# Patient Record
Sex: Female | Born: 1960 | Race: White | Hispanic: No | Marital: Single | Smoking: Former smoker
Health system: Southern US, Community
[De-identification: ages and names within clinical notes are randomized; demographics above are authoritative.]

## PROBLEM LIST (undated history)

## (undated) HISTORY — PX: BREAST ENHANCEMENT SURGERY: SHX7

## (undated) HISTORY — PX: ABDOMINOPLASTY: SUR9

## (undated) HISTORY — PX: LEG SURGERY: SHX1003

---

## 2006-08-07 ENCOUNTER — Encounter: Admission: RE | Admit: 2006-08-07 | Discharge: 2006-08-07 | Payer: Self-pay | Admitting: Obstetrics and Gynecology

## 2007-09-16 ENCOUNTER — Encounter: Admission: RE | Admit: 2007-09-16 | Discharge: 2007-09-16 | Payer: Self-pay | Admitting: Obstetrics and Gynecology

## 2011-04-16 ENCOUNTER — Emergency Department (HOSPITAL_COMMUNITY)
Admission: EM | Admit: 2011-04-16 | Discharge: 2011-04-17 | Disposition: A | Discharge status: Home / Self Care | Payer: BC Managed Care – PPO | Attending: Emergency Medicine | Admitting: Emergency Medicine

## 2011-04-16 DIAGNOSIS — R0609 Other forms of dyspnea: Secondary | ICD-10-CM | POA: Insufficient documentation

## 2011-04-16 DIAGNOSIS — R0989 Other specified symptoms and signs involving the circulatory and respiratory systems: Secondary | ICD-10-CM | POA: Insufficient documentation

## 2011-04-16 DIAGNOSIS — M7989 Other specified soft tissue disorders: Secondary | ICD-10-CM | POA: Insufficient documentation

## 2011-04-16 DIAGNOSIS — M79609 Pain in unspecified limb: Secondary | ICD-10-CM | POA: Insufficient documentation

## 2011-04-17 ENCOUNTER — Emergency Department (HOSPITAL_COMMUNITY)
Admission: EM | Admit: 2011-04-17 | Discharge: 2011-04-17 | Disposition: A | Discharge status: Home / Self Care | Payer: BC Managed Care – PPO | Attending: Emergency Medicine | Admitting: Emergency Medicine

## 2011-04-17 ENCOUNTER — Ambulatory Visit (HOSPITAL_COMMUNITY)
Admission: RE | Admit: 2011-04-17 | Discharge: 2011-04-17 | Disposition: A | Discharge status: Home / Self Care | Payer: BC Managed Care – PPO | Source: Ambulatory Visit

## 2011-04-17 DIAGNOSIS — M79609 Pain in unspecified limb: Secondary | ICD-10-CM | POA: Insufficient documentation

## 2011-04-17 DIAGNOSIS — M7989 Other specified soft tissue disorders: Secondary | ICD-10-CM | POA: Insufficient documentation

## 2011-04-17 DIAGNOSIS — I824Z9 Acute embolism and thrombosis of unspecified deep veins of unspecified distal lower extremity: Secondary | ICD-10-CM | POA: Insufficient documentation

## 2012-07-13 ENCOUNTER — Other Ambulatory Visit: Payer: Self-pay | Admitting: Internal Medicine

## 2012-07-13 DIAGNOSIS — R609 Edema, unspecified: Secondary | ICD-10-CM

## 2012-07-14 ENCOUNTER — Ambulatory Visit
Admission: RE | Admit: 2012-07-14 | Discharge: 2012-07-14 | Disposition: A | Discharge status: Home / Self Care | Payer: BC Managed Care – PPO | Source: Ambulatory Visit | Attending: Internal Medicine | Admitting: Internal Medicine

## 2012-07-14 DIAGNOSIS — R609 Edema, unspecified: Secondary | ICD-10-CM

## 2015-07-10 ENCOUNTER — Emergency Department (HOSPITAL_COMMUNITY): Payer: No Typology Code available for payment source

## 2015-07-10 ENCOUNTER — Encounter (HOSPITAL_COMMUNITY): Payer: Self-pay | Admitting: Family Medicine

## 2015-07-10 ENCOUNTER — Emergency Department (HOSPITAL_COMMUNITY)
Admission: EM | Admit: 2015-07-10 | Discharge: 2015-07-10 | Disposition: A | Discharge status: Home / Self Care | Payer: No Typology Code available for payment source | Attending: Emergency Medicine | Admitting: Emergency Medicine

## 2015-07-10 DIAGNOSIS — R002 Palpitations: Secondary | ICD-10-CM | POA: Diagnosis not present

## 2015-07-10 DIAGNOSIS — Z7982 Long term (current) use of aspirin: Secondary | ICD-10-CM | POA: Diagnosis not present

## 2015-07-10 DIAGNOSIS — Z79899 Other long term (current) drug therapy: Secondary | ICD-10-CM | POA: Diagnosis not present

## 2015-07-10 DIAGNOSIS — Z87891 Personal history of nicotine dependence: Secondary | ICD-10-CM | POA: Insufficient documentation

## 2015-07-10 DIAGNOSIS — R0789 Other chest pain: Secondary | ICD-10-CM

## 2015-07-10 LAB — BASIC METABOLIC PANEL
Anion gap: 9 (ref 5–15)
BUN: 13 mg/dL (ref 6–20)
CO2: 24 mmol/L (ref 22–32)
CREATININE: 0.7 mg/dL (ref 0.44–1.00)
Calcium: 9.2 mg/dL (ref 8.9–10.3)
Chloride: 106 mmol/L (ref 101–111)
Glucose, Bld: 108 mg/dL — ABNORMAL HIGH (ref 65–99)
Potassium: 3.7 mmol/L (ref 3.5–5.1)
SODIUM: 139 mmol/L (ref 135–145)

## 2015-07-10 LAB — CBC
HCT: 38.6 % (ref 36.0–46.0)
Hemoglobin: 13.2 g/dL (ref 12.0–15.0)
MCH: 34.6 pg — ABNORMAL HIGH (ref 26.0–34.0)
MCHC: 34.2 g/dL (ref 30.0–36.0)
MCV: 101 fL — ABNORMAL HIGH (ref 78.0–100.0)
PLATELETS: 234 10*3/uL (ref 150–400)
RBC: 3.82 MIL/uL — ABNORMAL LOW (ref 3.87–5.11)
RDW: 12 % (ref 11.5–15.5)
WBC: 7.5 10*3/uL (ref 4.0–10.5)

## 2015-07-10 LAB — I-STAT TROPONIN, ED
TROPONIN I, POC: 0 ng/mL (ref 0.00–0.08)
TROPONIN I, POC: 0 ng/mL (ref 0.00–0.08)

## 2015-07-10 MED ORDER — ASPIRIN 81 MG PO CHEW
324.0000 mg | CHEWABLE_TABLET | Freq: Once | ORAL | Status: AC
  Administered 2015-07-10: 324 mg via ORAL
  Filled 2015-07-10: qty 4

## 2015-07-10 MED ORDER — NITROGLYCERIN 2 % TD OINT
1.0000 [in_us] | TOPICAL_OINTMENT | Freq: Once | TRANSDERMAL | Status: AC
  Administered 2015-07-10: 1 [in_us] via TOPICAL
  Filled 2015-07-10: qty 30

## 2015-07-10 NOTE — Discharge Instructions (Signed)
You might consider stopping the topiramate and the phentermine which may be causing some of your symptoms of feeling like her heart is beating too hard or too fast. You can call Dr. Michaelle CopasSmith's office to have him recheck you. Your tests in the ED today were normal without evidence of a acute cardiac problem. Your heart rate was normal and your cardiac enzymes which would indicate a heart attack were normal. Recheck if you get worse.   Palpitations A palpitation is the feeling that your heartbeat is irregular or is faster than normal. It may feel like your heart is fluttering or skipping a beat. Palpitations are usually not a serious problem. However, in some cases, you may need further medical evaluation. CAUSES  Palpitations can be caused by:  Smoking.  Caffeine or other stimulants, such as diet pills or energy drinks.  Alcohol.  Stress and anxiety.  Strenuous physical activity.  Fatigue.  Certain medicines.  Heart disease, especially if you have a history of irregular heart rhythms (arrhythmias), such as atrial fibrillation, atrial flutter, or supraventricular tachycardia.  An improperly working pacemaker or defibrillator. DIAGNOSIS  To find the cause of your palpitations, your health care provider will take your medical history and perform a physical exam. Your health care provider may also have you take a test called an ambulatory electrocardiogram (ECG). An ECG records your heartbeat patterns over a 24-hour period. You may also have other tests, such as:  Transthoracic echocardiogram (TTE). During echocardiography, sound waves are used to evaluate how blood flows through your heart.  Transesophageal echocardiogram (TEE).  Cardiac monitoring. This allows your health care provider to monitor your heart rate and rhythm in real time.  Holter monitor. This is a portable device that records your heartbeat and can help diagnose heart arrhythmias. It allows your health care provider to  track your heart activity for several days, if needed.  Stress tests by exercise or by giving medicine that makes the heart beat faster. TREATMENT  Treatment of palpitations depends on the cause of your symptoms and can vary greatly. Most cases of palpitations do not require any treatment other than time, relaxation, and monitoring your symptoms. Other causes, such as atrial fibrillation, atrial flutter, or supraventricular tachycardia, usually require further treatment. HOME CARE INSTRUCTIONS   Avoid:  Caffeinated coffee, tea, soft drinks, diet pills, and energy drinks.  Chocolate.  Alcohol.  Stop smoking if you smoke.  Reduce your stress and anxiety. Things that can help you relax include:  A method of controlling things in your body, such as your heartbeats, with your mind (biofeedback).  Yoga.  Meditation.  Physical activity such as swimming, jogging, or walking.  Get plenty of rest and sleep. SEEK MEDICAL CARE IF:   You continue to have a fast or irregular heartbeat beyond 24 hours.  Your palpitations occur more often. SEEK IMMEDIATE MEDICAL CARE IF:  You have chest pain or shortness of breath.  You have a severe headache.  You feel dizzy or you faint. MAKE SURE YOU:  Understand these instructions.  Will watch your condition.  Will get help right away if you are not doing well or get worse.   This information is not intended to replace advice given to you by your health care provider. Make sure you discuss any questions you have with your health care provider.   Document Released: 08/23/2000 Document Revised: 08/31/2013 Document Reviewed: 10/25/2011 Elsevier Interactive Patient Education [employer]2016 Elsevier Inc.

## 2015-07-10 NOTE — ED Notes (Addendum)
Awake. Verbally responsive. A/O x4. Resp even and unlabored. No audible adventitious breath sounds noted. ABC's intact. SR on monitor. Pt reported improvement in chest pain since Nitro paste application. IV saline lock patent and intact. Family at bedside.

## 2015-07-10 NOTE — ED Notes (Signed)
Removed nitropaste from chest wall area

## 2015-07-10 NOTE — ED Notes (Signed)
Pt states she started experiencing a fast, hard heart beat starting around 22:00 last night. Pt started taking Phentermine and Topiramate about 2 weeks ago.   :

## 2015-07-10 NOTE — ED Provider Notes (Signed)
CSN: [id number]     Arrival date & time 07/10/15  0425 History   First MD Initiated Contact with Patient 07/10/15 [phone]   Chief Complaint  Patient presents with  . Palpitations     (Consider location/radiation/quality/duration/timing/severity/associated sxs/prior Treatment) HPI  Patient states she laid down and tried to go to sleep about 10 PM however she felt like her heart was not beating right and states she felt like it was beating hard in beating fast. She states she still feels it at the time of my exam and when I look at the monitor her heart rate is in the 70s. She states she had some chest tightness that has been coming and going and is worse when she lays down. She denies shortness of breath, diaphoresis, nausea, or vomiting. She states she has seen Dr. Garnette ScheuermannHank Smith about 6 or 7 years ago while she was taking high-dose prednisone for some poison ivy. At that time she had some fast heartbeat. She does not recall that he did any tests such as a stress test and he would not start her on any new medication. She states the only other thing that is new in her life is 2 weeks ago she started taking phentermine and topiramate for weight loss. Her only other medication are 2 baby aspirin in the morning which she has not taken yet today.   PCP Dr Jerelyn ScottN [facility] Dr Mendel RyderH Smith  History reviewed. No pertinent past medical history. Past Surgical History  Procedure Laterality Date  . Leg surgery Right     Femur   History reviewed. No pertinent family history. Social History  Substance Use Topics  . Smoking status: Former Games developermoker  . Smokeless tobacco: None  . Alcohol Use: No   Employed Social ETOH  OB History    No data available     Review of Systems  All other systems reviewed and are negative.     Allergies  Review of patient's allergies indicates no known allergies.  Home Medications   Prior to Admission medications   Medication Sig Start Date End Date Taking? Authorizing  Provider  aspirin EC 81 MG tablet Take 162 mg by mouth daily.   Yes Historical Provider, MD  phentermine 15 MG capsule Take 15 mg by mouth daily. 06/07/15  Yes Historical Provider, MD  topiramate (TOPAMAX) 25 MG tablet Take 25 mg by mouth 2 (two) times daily. 06/07/15  Yes Historical Provider, MD   BP 118/75 mmHg  Pulse 64  Temp(Src) 97.4 F (36.3 C) (Oral)  Resp 15  Ht 5\' 4"  (1.626 m)  Wt 135 lb (61.236 kg)  BMI 23.16 kg/m2  SpO2 94%  LMP   Vital signs normal   Physical Exam  Constitutional: She is oriented to person, place, and time. She appears well-developed and well-nourished.  Non-toxic appearance. She does not appear ill. No distress.  HENT:  Head: Normocephalic and atraumatic.  Right Ear: External ear normal.  Left Ear: External ear normal.  Nose: Nose normal. No mucosal edema or rhinorrhea.  Mouth/Throat: Oropharynx is clear and moist and mucous membranes are normal. No dental abscesses or uvula swelling.  Eyes: Conjunctivae and EOM are normal. Pupils are equal, round, and reactive to light.  Neck: Normal range of motion and full passive range of motion without pain. Neck supple.  Cardiovascular: Normal rate, regular rhythm and normal heart sounds.  Exam reveals no gallop and no friction rub.   No murmur heard. Pulmonary/Chest: Effort normal and breath sounds  normal. No respiratory distress. She has no wheezes. She has no rhonchi. She has no rales. She exhibits no tenderness and no crepitus.  Abdominal: Soft. Normal appearance and bowel sounds are normal. She exhibits no distension. There is no tenderness. There is no rebound and no guarding.  Musculoskeletal: Normal range of motion. She exhibits no edema or tenderness.  Moves all extremities well.   Neurological: She is alert and oriented to person, place, and time. She has normal strength. No cranial nerve deficit.  Skin: Skin is warm, dry and intact. No rash noted. No erythema. No pallor.  Psychiatric: She has a normal  mood and affect. Her speech is normal and behavior is normal. Her mood appears not anxious.  Nursing note and vitals reviewed.   ED Course  Procedures (including critical care time)  Medications  aspirin chewable tablet 324 mg (324 mg Oral Given 07/10/15 0548)  nitroGLYCERIN (NITROGLYN) 2 % ointment 1 inch (1 inch Topical Given 07/10/15 0549)     Patient was placed on a monitored bed. She had no ectopy seen.   Recheck 07:25 we discussed her test results. Will get second troponin and if negative she can be discharged.    Patient was given her negative second troponin. She was discharged from the ED. We discussed stopping her topiramate and her phentermine since these are the only new things that she has been exposed to that could be causing some of her palpitations.  Labs Review Results for orders placed or performed during the hospital encounter of 07/10/15  Basic metabolic panel  Result Value Ref Range   Sodium 139 135 - 145 mmol/L   Potassium 3.7 3.5 - 5.1 mmol/L   Chloride 106 101 - 111 mmol/L   CO2 24 22 - 32 mmol/L   Glucose, Bld 108 (H) 65 - 99 mg/dL   BUN 13 6 - 20 mg/dL   Creatinine, Ser 1.61 0.44 - 1.00 mg/dL   Calcium 9.2 8.9 - 09.6 mg/dL   GFR calc non Af Amer >60 >60 mL/min   GFR calc Af Amer >60 >60 mL/min   Anion gap 9 5 - 15  CBC  Result Value Ref Range   WBC 7.5 4.0 - 10.5 K/uL   RBC 3.82 (L) 3.87 - 5.11 MIL/uL   Hemoglobin 13.2 12.0 - 15.0 g/dL   HCT 04.5 40.9 - 81.1 %   MCV 101.0 (H) 78.0 - 100.0 fL   MCH 34.6 (H) 26.0 - 34.0 pg   MCHC 34.2 30.0 - 36.0 g/dL   RDW 91.4 78.2 - 95.6 %   Platelets 234 150 - 400 K/uL  I-stat troponin, ED  Result Value Ref Range   Troponin i, poc 0.00 0.00 - 0.08 ng/mL   Comment 3          I-stat troponin, ED  Result Value Ref Range   Troponin i, poc 0.00 0.00 - 0.08 ng/mL   Comment 3           Laboratory interpretation all normal     Imaging Review Dg Chest 2 View  07/10/2015  CLINICAL DATA:  Palpitations,  onset at 22:00. EXAM: CHEST  2 VIEW COMPARISON:  None. FINDINGS: The heart size and mediastinal contours are within normal limits. Both lungs are clear. The visualized skeletal structures are unremarkable. IMPRESSION: No active cardiopulmonary disease. Electronically Signed   By: Ellery Plunk M.D.   On: 07/10/2015 05:42   I have personally reviewed and evaluated these images and lab results as  part of my medical decision-making.   EKG Interpretation   Date/Time:  Monday July 10 2015 04:37:42 EDT Ventricular Rate:  81 PR Interval:  144 QRS Duration: 79 QT Interval:  374 QTC Calculation: 434 R Axis:   36 Text Interpretation:  Sinus rhythm Baseline wander in lead(s) II aVR aVF  Otherwise within normal limits No old tracing to compare Confirmed by  Hester Forget  MD-I, Marquesa Rath ([id number]) on 07/10/2015 4:48:24 AM      MDM   Final diagnoses:  Palpitations  Chest tightness   Plan discharge  Devoria Albe, MD, Concha Pyo, MD 07/10/15 [phone]

## 2015-07-10 NOTE — ED Notes (Signed)
Awake. Verbally responsive. A/O x4. Resp even and unlabored. No audible adventitious breath sounds noted. ABC's intact.  

## 2017-11-10 ENCOUNTER — Ambulatory Visit
Admission: RE | Admit: 2017-11-10 | Discharge: 2017-11-10 | Disposition: A | Discharge status: Home / Self Care | Payer: No Typology Code available for payment source | Source: Ambulatory Visit | Attending: Internal Medicine | Admitting: Internal Medicine

## 2017-11-10 ENCOUNTER — Other Ambulatory Visit: Payer: Self-pay | Admitting: Internal Medicine

## 2017-11-10 DIAGNOSIS — R0781 Pleurodynia: Secondary | ICD-10-CM

## 2019-11-05 ENCOUNTER — Ambulatory Visit: Payer: No Typology Code available for payment source | Attending: Internal Medicine

## 2019-11-05 DIAGNOSIS — Z23 Encounter for immunization: Secondary | ICD-10-CM

## 2019-11-05 NOTE — Progress Notes (Signed)
   Covid-19 Vaccination Clinic  Name:  Sema Stangler    MRN: [id number] DOB: [birth year]  11/05/2019  Ms. Flythe was observed post Covid-19 immunization for 15 minutes without incidence. She was provided with Vaccine Information Sheet and instruction to access the V-Safe system.   Ms. Walthour was instructed to call 911 with any severe reactions post vaccine: Marland Kitchen Difficulty breathing  . Swelling of your face and throat  . A fast heartbeat  . A bad rash all over your body  . Dizziness and weakness    Immunizations Administered    Name Date Dose VIS Date Route   Pfizer COVID-19 Vaccine 11/05/2019  3:38 PM 0.3 mL 08/20/2019 Intramuscular   Manufacturer: [employer], [employer]   Lot: [id number]   NDC: [address]-2

## 2019-12-01 ENCOUNTER — Ambulatory Visit: Payer: No Typology Code available for payment source | Attending: Internal Medicine

## 2019-12-01 DIAGNOSIS — Z23 Encounter for immunization: Secondary | ICD-10-CM

## 2019-12-01 NOTE — Progress Notes (Signed)
   Covid-19 Vaccination Clinic  Name:  Kari Marks    MRN: [id number] DOB: Jan 23, 1961  12/01/2019  [name] was observed post Covid-19 immunization for 15 minutes without incident. She was provided with Vaccine Information Sheet and instruction to access the V-Safe system.   [name] was instructed to call 911 with any severe reactions post vaccine: Marland Kitchen Difficulty breathing  . Swelling of face and throat  . A fast heartbeat  . A bad rash all over body  . Dizziness and weakness   Immunizations Administered    Name Date Dose VIS Date Route   Pfizer COVID-19 Vaccine 12/01/2019  2:17 PM 0.3 mL 08/20/2019 Intramuscular   Manufacturer: [employer], [employer]   Lot: [id number]   NDC: [address]-6

## 2020-05-19 ENCOUNTER — Other Ambulatory Visit: Payer: Self-pay | Admitting: Internal Medicine

## 2020-05-19 ENCOUNTER — Ambulatory Visit
Admission: RE | Admit: 2020-05-19 | Discharge: 2020-05-19 | Disposition: A | Discharge status: Home / Self Care | Payer: No Typology Code available for payment source | Source: Ambulatory Visit | Attending: Internal Medicine | Admitting: Internal Medicine

## 2020-05-19 ENCOUNTER — Other Ambulatory Visit: Payer: Self-pay

## 2020-05-19 DIAGNOSIS — Z1231 Encounter for screening mammogram for malignant neoplasm of breast: Secondary | ICD-10-CM

## 2020-10-16 IMAGING — MG DIGITAL SCREENING BREAST BILAT IMPLANT W/ TOMO W/ CAD
9 of 14 series · 9 of 34 positions shown · non-contrast
Comparison: Previous exam(s).

CLINICAL DATA: Screening.

EXAM:
DIGITAL SCREENING BILATERAL MAMMOGRAM WITH IMPLANTS, CAD AND TOMO

[L MLO]
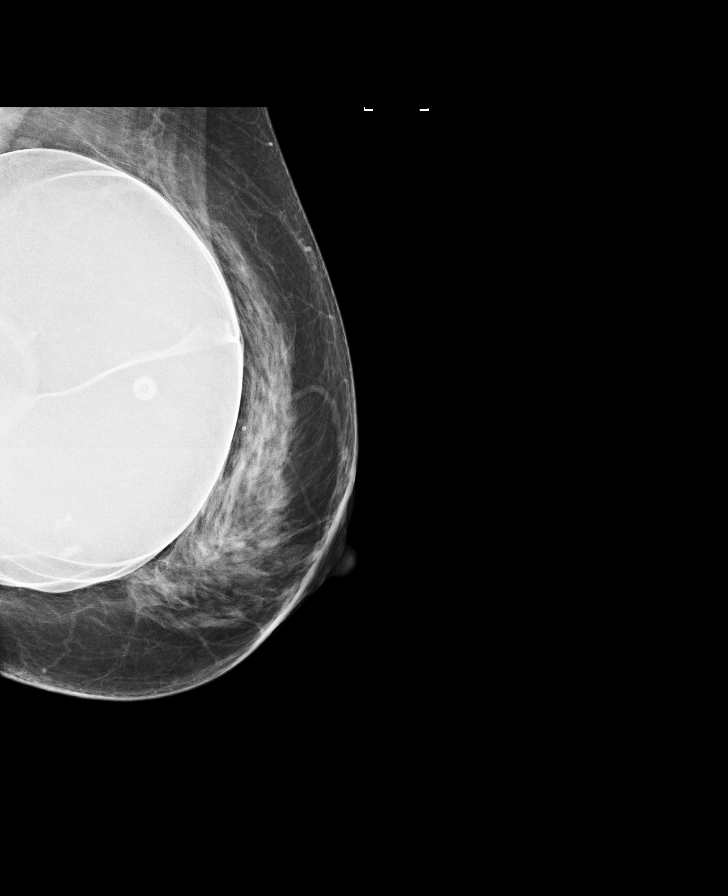

[R MLO]
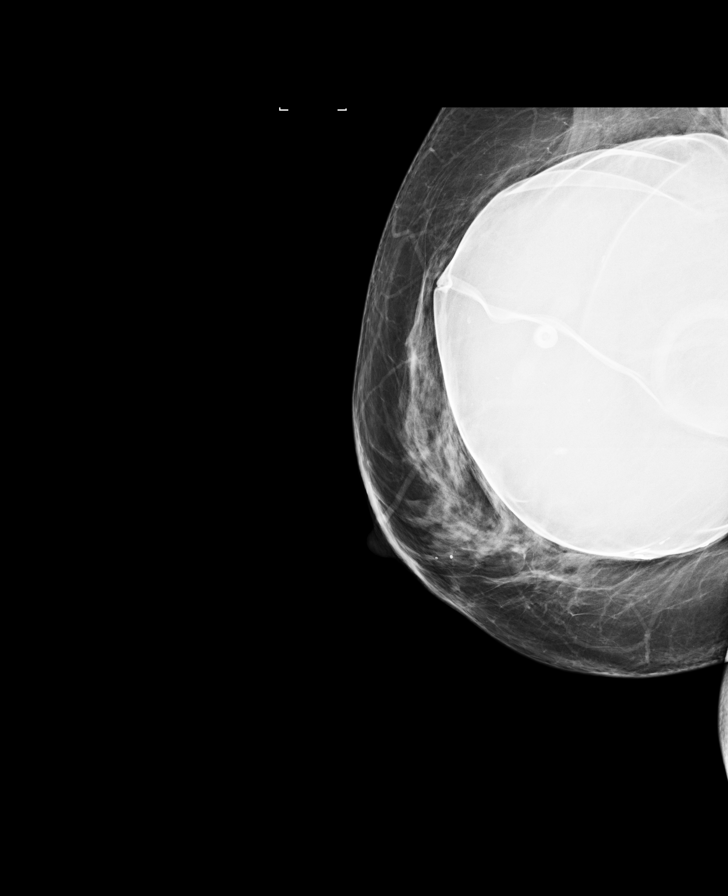

[R CC]
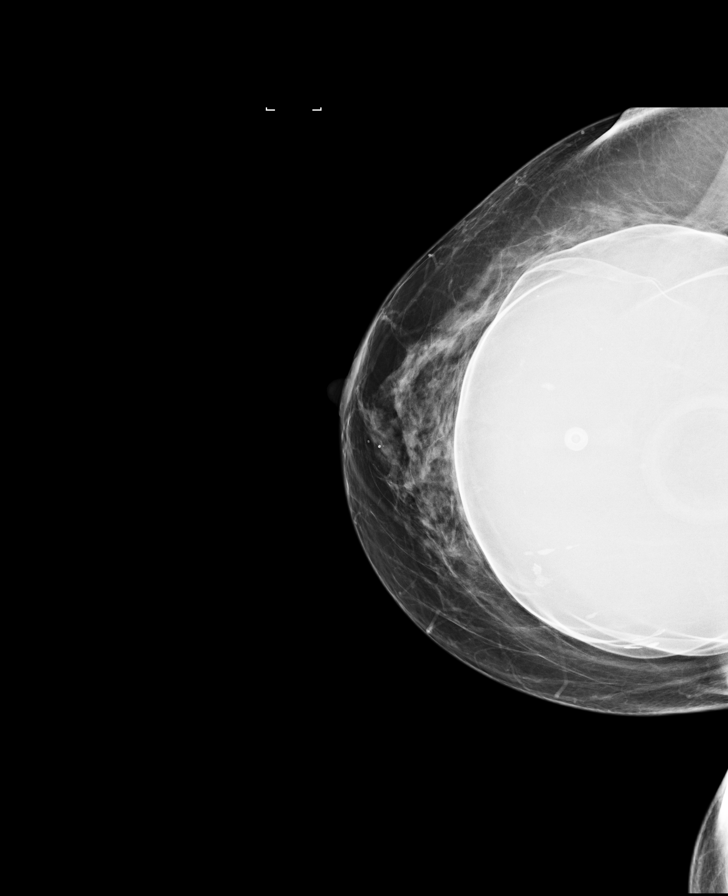

[L CC]
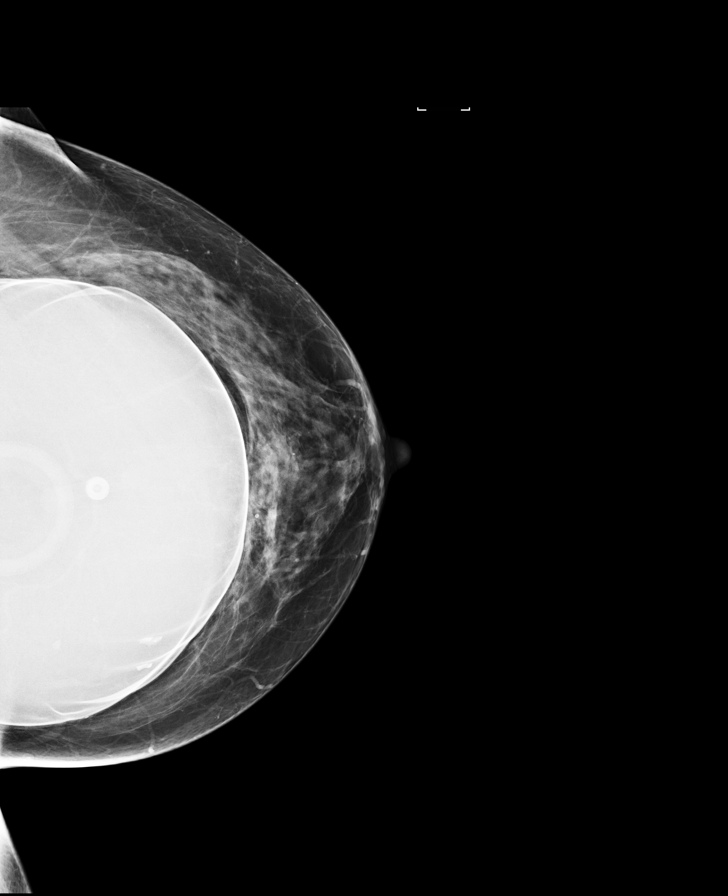

[R MLO synth-2D]
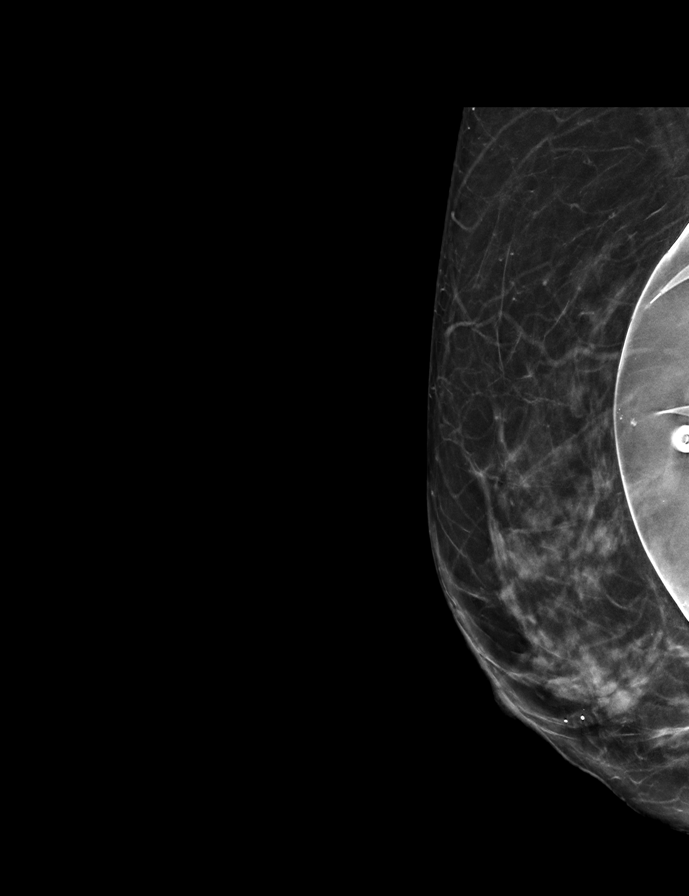

[L CC synth-2D]
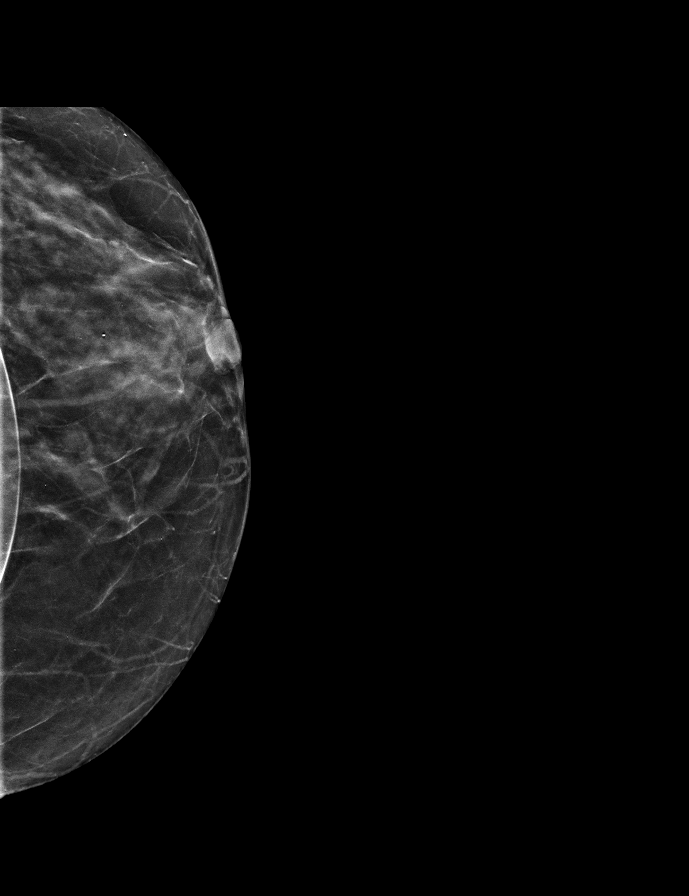

[R CC synth-2D]
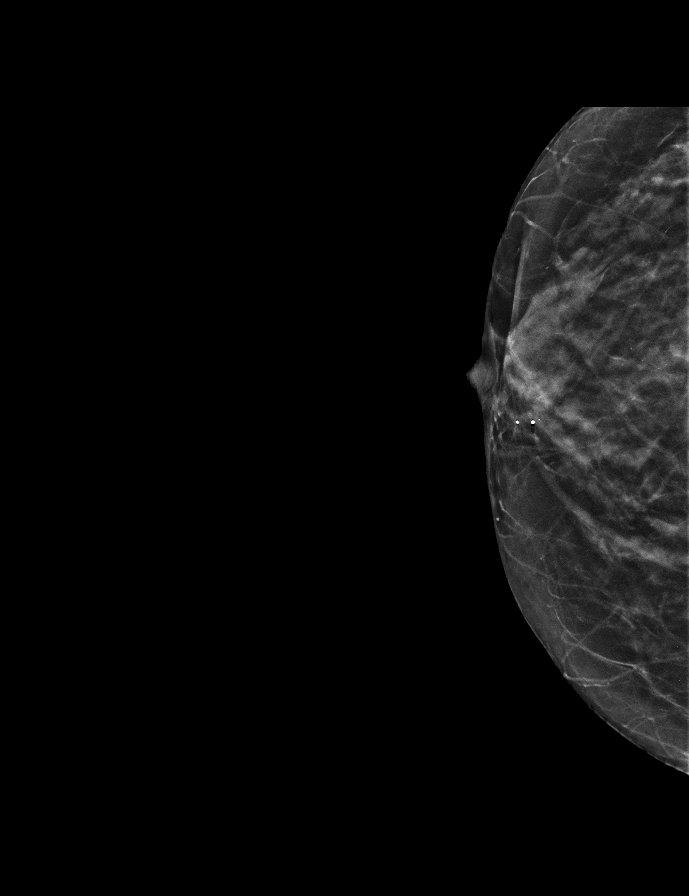

[L MLO synth-2D (1 of 2)]
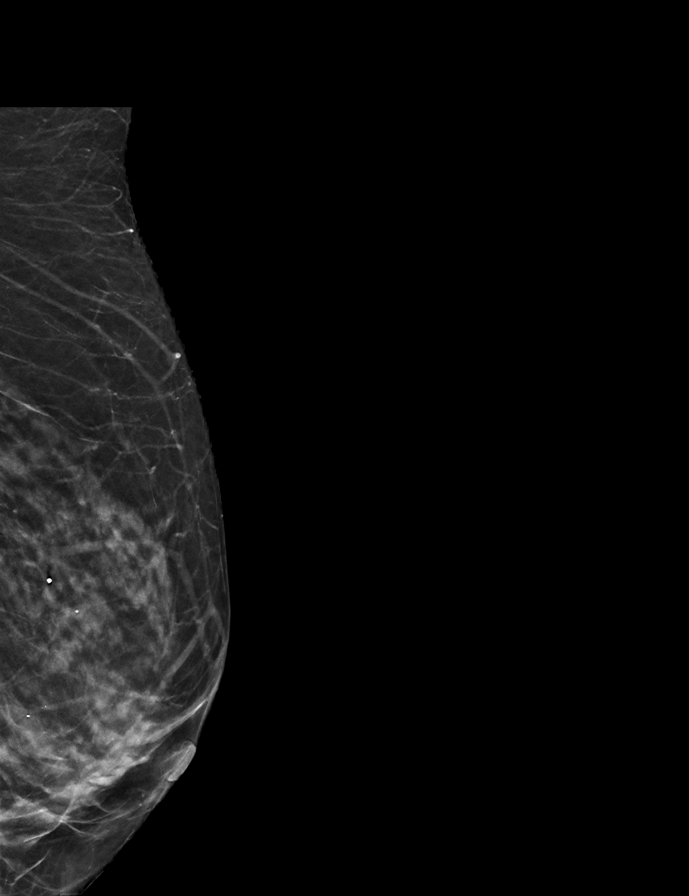

[L MLO synth-2D (2 of 2)]
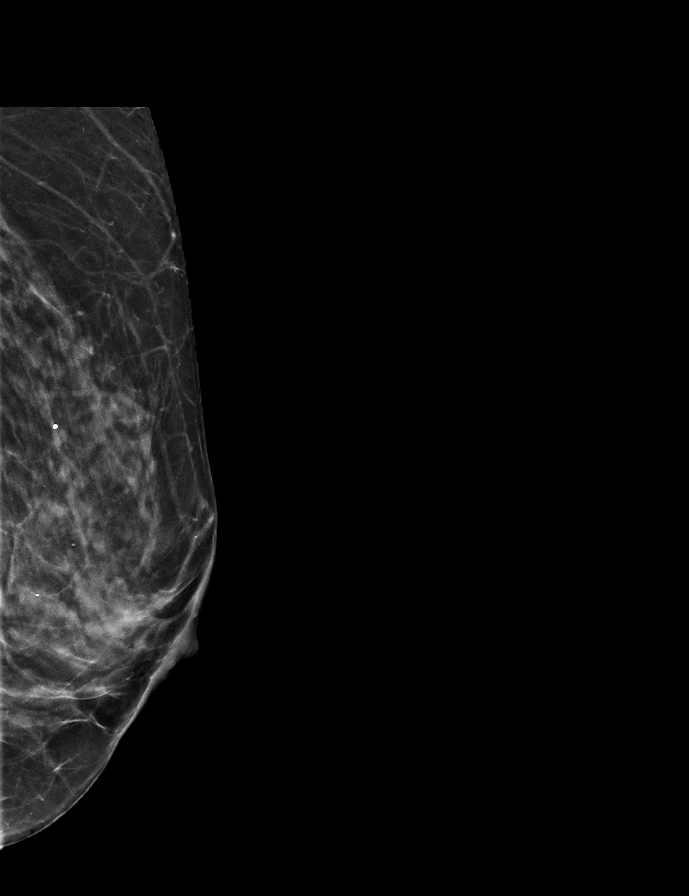

[9 of 34 positions shown; findings below may reference images not displayed]

ACR Breast Density Category b: There are scattered areas of
fibroglandular density.
FINDINGS: The patient has retropectoral saline implants. Standard 2D full
field CC and MLO views of both breasts and tomosynthesis and
synthesized implant displaced CC and MLO views of both breasts were
obtained.

There are no findings suspicious for malignancy. Images were
processed with CAD.
IMPRESSION: No mammographic evidence of malignancy. A result letter of this
screening mammogram will be mailed directly to the patient.

RECOMMENDATION:
Screening mammogram in one year. (Code:DI-[id number])

BI-RADS CATEGORY  1:  Negative.

## 2020-12-13 ENCOUNTER — Other Ambulatory Visit (HOSPITAL_COMMUNITY): Payer: Self-pay

## 2020-12-13 MED ORDER — METHOCARBAMOL 500 MG PO TABS
500.0000 mg | ORAL_TABLET | Freq: Four times a day (QID) | ORAL | 0 refills | Status: DC
  Filled 2020-12-13: qty 30, 8d supply, fill #0

## 2020-12-13 MED ORDER — ENOXAPARIN SODIUM 40 MG/0.4ML ~~LOC~~ SOLN
40.0000 mg | Freq: Every day | SUBCUTANEOUS | 0 refills | Status: DC
  Filled 2020-12-13: qty 12, 30d supply, fill #0

## 2020-12-13 MED ORDER — OXYCODONE-ACETAMINOPHEN 5-325 MG PO TABS
1.0000 | ORAL_TABLET | ORAL | 0 refills | Status: DC | PRN
  Filled 2020-12-13: qty 20, 4d supply, fill #0

## 2020-12-18 DIAGNOSIS — Z01818 Encounter for other preprocedural examination: Secondary | ICD-10-CM | POA: Diagnosis not present

## 2020-12-21 ENCOUNTER — Other Ambulatory Visit (HOSPITAL_COMMUNITY): Payer: Self-pay

## 2021-01-10 ENCOUNTER — Other Ambulatory Visit (HOSPITAL_COMMUNITY): Payer: Self-pay

## 2021-01-10 MED ORDER — ENOXAPARIN SODIUM 40 MG/0.4ML IJ SOSY
PREFILLED_SYRINGE | INTRAMUSCULAR | 0 refills | Status: DC
  Filled 2021-01-10: qty 2.8, 7d supply, fill #0

## 2021-01-11 ENCOUNTER — Other Ambulatory Visit (HOSPITAL_COMMUNITY): Payer: Self-pay

## 2021-02-19 ENCOUNTER — Other Ambulatory Visit (HOSPITAL_COMMUNITY): Payer: Self-pay

## 2021-02-19 DIAGNOSIS — Z13220 Encounter for screening for lipoid disorders: Secondary | ICD-10-CM | POA: Diagnosis not present

## 2021-02-19 DIAGNOSIS — Z0000 Encounter for general adult medical examination without abnormal findings: Secondary | ICD-10-CM | POA: Diagnosis not present

## 2021-02-19 DIAGNOSIS — Z1239 Encounter for other screening for malignant neoplasm of breast: Secondary | ICD-10-CM | POA: Diagnosis not present

## 2021-02-19 DIAGNOSIS — E559 Vitamin D deficiency, unspecified: Secondary | ICD-10-CM | POA: Diagnosis not present

## 2021-02-19 DIAGNOSIS — E538 Deficiency of other specified B group vitamins: Secondary | ICD-10-CM | POA: Diagnosis not present

## 2021-02-19 DIAGNOSIS — A6000 Herpesviral infection of urogenital system, unspecified: Secondary | ICD-10-CM | POA: Diagnosis not present

## 2021-02-19 DIAGNOSIS — Z1211 Encounter for screening for malignant neoplasm of colon: Secondary | ICD-10-CM | POA: Diagnosis not present

## 2021-02-19 MED ORDER — VITAMIN D3 50 MCG (2000 UT) PO CAPS: ORAL_CAPSULE | ORAL | 3 refills | Status: DC

## 2021-02-19 MED ORDER — ACYCLOVIR 400 MG PO TABS
400.0000 mg | ORAL_TABLET | Freq: Two times a day (BID) | ORAL | 3 refills | Status: DC | PRN
  Filled 2021-02-19: qty 60, 30d supply, fill #0

## 2021-02-20 ENCOUNTER — Other Ambulatory Visit (HOSPITAL_COMMUNITY): Payer: Self-pay

## 2021-06-15 DIAGNOSIS — Z1211 Encounter for screening for malignant neoplasm of colon: Secondary | ICD-10-CM | POA: Diagnosis not present

## 2021-06-15 DIAGNOSIS — Z1212 Encounter for screening for malignant neoplasm of rectum: Secondary | ICD-10-CM | POA: Diagnosis not present

## 2021-06-22 DIAGNOSIS — H2513 Age-related nuclear cataract, bilateral: Secondary | ICD-10-CM | POA: Diagnosis not present

## 2021-06-22 DIAGNOSIS — H5213 Myopia, bilateral: Secondary | ICD-10-CM | POA: Diagnosis not present

## 2021-06-22 DIAGNOSIS — H02831 Dermatochalasis of right upper eyelid: Secondary | ICD-10-CM | POA: Diagnosis not present

## 2021-08-31 DIAGNOSIS — Z01419 Encounter for gynecological examination (general) (routine) without abnormal findings: Secondary | ICD-10-CM | POA: Diagnosis not present

## 2021-08-31 DIAGNOSIS — Z113 Encounter for screening for infections with a predominantly sexual mode of transmission: Secondary | ICD-10-CM | POA: Diagnosis not present

## 2021-08-31 DIAGNOSIS — Z6825 Body mass index (BMI) 25.0-25.9, adult: Secondary | ICD-10-CM | POA: Diagnosis not present

## 2021-08-31 DIAGNOSIS — Z01411 Encounter for gynecological examination (general) (routine) with abnormal findings: Secondary | ICD-10-CM | POA: Diagnosis not present

## 2021-08-31 DIAGNOSIS — Z124 Encounter for screening for malignant neoplasm of cervix: Secondary | ICD-10-CM | POA: Diagnosis not present

## 2021-09-06 ENCOUNTER — Other Ambulatory Visit (HOSPITAL_COMMUNITY): Payer: Self-pay

## 2021-09-06 MED ORDER — CARESTART COVID-19 HOME TEST VI KIT
PACK | 1 refills | Status: DC
  Filled 2021-09-06: qty 4, 4d supply, fill #0

## 2021-09-11 DIAGNOSIS — M25551 Pain in right hip: Secondary | ICD-10-CM | POA: Diagnosis not present

## 2021-09-11 DIAGNOSIS — M79604 Pain in right leg: Secondary | ICD-10-CM | POA: Diagnosis not present

## 2021-09-11 DIAGNOSIS — M545 Low back pain, unspecified: Secondary | ICD-10-CM | POA: Diagnosis not present

## 2021-12-24 ENCOUNTER — Other Ambulatory Visit (HOSPITAL_COMMUNITY): Payer: Self-pay

## 2021-12-24 MED ORDER — OMRON 3 SERIES BP MONITOR DEVI
0 refills | Status: AC
  Filled 2021-12-24: qty 1, 30d supply, fill #0

## 2022-03-16 ENCOUNTER — Emergency Department (HOSPITAL_COMMUNITY)
Admission: EM | Admit: 2022-03-16 | Discharge: 2022-03-17 | Disposition: A | Discharge status: Home / Self Care | Payer: 59 | Attending: Emergency Medicine | Admitting: Emergency Medicine

## 2022-03-16 ENCOUNTER — Emergency Department (HOSPITAL_COMMUNITY): Payer: 59

## 2022-03-16 ENCOUNTER — Other Ambulatory Visit: Payer: Self-pay

## 2022-03-16 DIAGNOSIS — W010XXA Fall on same level from slipping, tripping and stumbling without subsequent striking against object, initial encounter: Secondary | ICD-10-CM | POA: Diagnosis not present

## 2022-03-16 DIAGNOSIS — S199XXA Unspecified injury of neck, initial encounter: Secondary | ICD-10-CM | POA: Diagnosis not present

## 2022-03-16 DIAGNOSIS — S022XXA Fracture of nasal bones, initial encounter for closed fracture: Secondary | ICD-10-CM | POA: Diagnosis not present

## 2022-03-16 DIAGNOSIS — M4319 Spondylolisthesis, multiple sites in spine: Secondary | ICD-10-CM | POA: Diagnosis not present

## 2022-03-16 DIAGNOSIS — S52612A Displaced fracture of left ulna styloid process, initial encounter for closed fracture: Secondary | ICD-10-CM | POA: Diagnosis not present

## 2022-03-16 DIAGNOSIS — M25532 Pain in left wrist: Secondary | ICD-10-CM | POA: Diagnosis not present

## 2022-03-16 DIAGNOSIS — S6992XA Unspecified injury of left wrist, hand and finger(s), initial encounter: Secondary | ICD-10-CM | POA: Diagnosis present

## 2022-03-16 DIAGNOSIS — Z7982 Long term (current) use of aspirin: Secondary | ICD-10-CM | POA: Insufficient documentation

## 2022-03-16 DIAGNOSIS — S0990XA Unspecified injury of head, initial encounter: Secondary | ICD-10-CM | POA: Diagnosis not present

## 2022-03-16 DIAGNOSIS — S52502A Unspecified fracture of the lower end of left radius, initial encounter for closed fracture: Secondary | ICD-10-CM | POA: Insufficient documentation

## 2022-03-16 DIAGNOSIS — M47812 Spondylosis without myelopathy or radiculopathy, cervical region: Secondary | ICD-10-CM | POA: Diagnosis not present

## 2022-03-16 DIAGNOSIS — S52572A Other intraarticular fracture of lower end of left radius, initial encounter for closed fracture: Secondary | ICD-10-CM | POA: Diagnosis not present

## 2022-03-16 DIAGNOSIS — M7989 Other specified soft tissue disorders: Secondary | ICD-10-CM | POA: Diagnosis not present

## 2022-03-16 DIAGNOSIS — J929 Pleural plaque without asbestos: Secondary | ICD-10-CM | POA: Diagnosis not present

## 2022-03-16 MED ORDER — LIDOCAINE HCL (PF) 1 % IJ SOLN
10.0000 mL | Freq: Once | INTRAMUSCULAR | Status: AC
  Administered 2022-03-16: 10 mL
  Filled 2022-03-16: qty 30

## 2022-03-16 MED ORDER — OXYCODONE-ACETAMINOPHEN 5-325 MG PO TABS
1.0000 | ORAL_TABLET | Freq: Once | ORAL | Status: DC
  Filled 2022-03-16: qty 1

## 2022-03-16 MED ORDER — ONDANSETRON 4 MG PO TBDP
4.0000 mg | ORAL_TABLET | Freq: Once | ORAL | Status: DC
  Filled 2022-03-16: qty 1

## 2022-03-16 MED ORDER — ONDANSETRON 4 MG PO TBDP
4.0000 mg | ORAL_TABLET | Freq: Once | ORAL | Status: AC
  Administered 2022-03-16: 4 mg via ORAL
  Filled 2022-03-16: qty 1

## 2022-03-16 MED ORDER — HYDROMORPHONE HCL 1 MG/ML IJ SOLN
1.0000 mg | Freq: Once | INTRAMUSCULAR | Status: AC
  Administered 2022-03-16: 1 mg via INTRAMUSCULAR
  Filled 2022-03-16: qty 1

## 2022-03-16 NOTE — ED Provider Triage Note (Signed)
Emergency Medicine Provider Triage Evaluation Note  Kari Marks , a 61 y.o. female  was evaluated in triage.  Pt complains of ground level fall. Earlier today the patient tripped and landed on her left wrist which "gave out". She scrapped up her face and tore her frenulum in her mouth. Denies any LOC. She denies any neck or back pain.   Review of Systems  Positive:  Negative:   Physical Exam  Pulse 84   Temp 98.4 F (36.9 C) (Oral)   Resp 18   Ht 5\' 4"  (1.626 m)   Wt 61.2 kg   SpO2 95%   BMI 23.17 kg/m  Gen:   Awake, no distress   Resp:  Normal effort  MSK:   Moves extremities without difficulty  Other:  Deformity to the left wrist. Cap refill intact. Palpable pulses. Sensation intact. Coloration symmetric.   Medical Decision Making  Medically screening exam initiated at 8:47 PM.  Appropriate orders placed.  Abriel Hattery was informed that the remainder of the evaluation will be completed by another provider, this initial triage assessment does not replace that evaluation, and the importance of remaining in the ED until their evaluation is complete.  Will order CT imaging and left wrist xr.    Fredia Sorrow, Achille Rich 03/16/22 2049

## 2022-03-16 NOTE — Discharge Instructions (Addendum)
Please follow-up with Dr. Merlyn Lot, the hand specialist, call his office on Monday to get an appointment.  He said you will need surgery for this fracture.   For your nose fracture, follow-up with Dr. Jenne Pane.  For pain control recommend taking Tylenol Motrin.  For breakthrough pain take the Percocet.  If you develop severe pain, swelling, numbness, skin discoloration in your hand, come back to ER for reassessment.  Recommend no weightbearing in your left arm.

## 2022-03-16 NOTE — ED Triage Notes (Signed)
Ambulatory to ED s/p fall after tripping over a stump. Landed on L wrist and then face planted. + deformity on L wrist, bloody nose with swelling. No blood thinners, no LOC. 2+ pulses, full sensation in L wrist.

## 2022-03-16 NOTE — ED Provider Notes (Signed)
[facility] DEPT Provider Note   CSN: [id number] Arrival date & time: 03/16/22  2016     History {Add pertinent medical, surgical, social history, OB history to HPI:1} Chief Complaint  Patient presents with   Kari Marks is a 61 y.o. female.  Presenting to ER after fall.  HPI     Home Medications Prior to Admission medications   Medication Sig Start Date End Date Taking? Authorizing Provider  acyclovir (ZOVIRAX) 400 MG tablet Take 1 tablet (400 mg total) by mouth 2 (two) times daily as needed. 02/19/21     aspirin EC 81 MG tablet Take 162 mg by mouth daily.    [provider]  Blood Pressure Monitoring (OMRON 3 SERIES BP MONITOR) DEVI Use as directed 12/24/21   Belanger, Kellie Shropshire, RPH  Cholecalciferol (VITAMIN D3) 50 MCG (2000 UT) capsule Take 1 capsule by mouth once daily 02/19/21     COVID-19 At Home Antigen Test [facility] COVID-19 HOME TEST) KIT Use as directed 09/06/21   Edmon Crape, RPH  enoxaparin (LOVENOX) 40 MG/0.4ML injection Inject 0.4 mLs (40 mg total) into the skin on alternating thigh daily. 12/13/20     enoxaparin (LOVENOX) 40 MG/0.4ML injection Inject 1 syringe into the skin daily for 7 days. (Alternate thigh daily) 01/10/21     methocarbamol (ROBAXIN) 500 MG tablet Take 1 tablet (500 mg total) by mouth 4 (four) times daily. 12/13/20     oxyCODONE-acetaminophen (PERCOCET/ROXICET) 5-325 MG tablet Take 1 tablet by mouth every 4 (four) hours as needed for up to 20 doses. 12/13/20     phentermine 15 MG capsule Take 15 mg by mouth daily. 06/07/15   [provider]  topiramate (TOPAMAX) 25 MG tablet Take 25 mg by mouth 2 (two) times daily. 06/07/15   [provider]      Allergies    Patient has no known allergies.    Review of Systems   Review of Systems  Physical Exam Updated Vital Signs BP 122/84 (BP Location: Right Arm)   Pulse 84   Temp 98.4 F (36.9 C) (Oral)   Resp 18   Ht 5' 4" (1.626 m)    Wt 61.2 kg   SpO2 95%   BMI 23.17 kg/m  Physical Exam  ED Results / Procedures / Treatments   Labs (all labs ordered are listed, but only abnormal results are displayed) Labs Reviewed - No data to display  EKG None  Radiology CT Maxillofacial Wo Contrast  Result Date: 03/16/2022 CLINICAL DATA:  Facial trauma EXAM: CT MAXILLOFACIAL WITHOUT CONTRAST TECHNIQUE: Multidetector CT imaging of the maxillofacial structures was performed. Multiplanar CT image reconstructions were also generated. RADIATION DOSE REDUCTION: This exam was performed according to the departmental dose-optimization program which includes automated exposure control, adjustment of the mA and/or kV according to patient size and/or use of iterative reconstruction technique. COMPARISON:  None Available. FINDINGS: Osseous: Acute fractures of the bilateral nasal bones with a 2 mm displacement of the left nasal bone fracture fragment and 1 mm depression of the right nasal bone fracture. No additional fracture identified. Orbits: Negative. No traumatic or inflammatory finding. Sinuses: Clear. Soft tissues: Mild soft tissue swelling over the nasal region. Limited intracranial: No acute process. IMPRESSION: Acute bilateral nasal bone fractures. Electronically Signed   By: Ofilia Neas M.D.   On: 03/16/2022 21:43   DG Wrist Complete Left  Result Date: 03/16/2022 CLINICAL DATA:  Fall, landed on left wrist EXAM: LEFT WRIST -  COMPLETE 3+ VIEW COMPARISON:  Hand series today FINDINGS: There is a comminuted intra-articular fracture through the distal left radius. Distal fracture fragments are displaced and angulated posteriorly. Displaced ulnar styloid fracture. Overlying soft tissue swelling. No subluxation or dislocation. IMPRESSION: Comminuted, intra-articular displaced and angulated distal left radial fracture. Ulnar styloid fracture. Electronically Signed   By: Rolm Baptise M.D.   On: 03/16/2022 21:26   DG Hand Complete Left  Result  Date: 03/16/2022 CLINICAL DATA:  Fall, left wrist pain EXAM: LEFT HAND - COMPLETE 3+ VIEW COMPARISON:  Left wrist today FINDINGS: Comminuted intra-articular fracture noted in the distal left radius. Displaced ulnar styloid fracture also noted. These are better visualized on wrist series. No additional fracture within the left hand. IMPRESSION: Distal left radial and ulnar styloid fractures seen, better visualized on wrist series. Electronically Signed   By: Rolm Baptise M.D.   On: 03/16/2022 21:24   CT Cervical Spine Wo Contrast  Result Date: 03/16/2022 CLINICAL DATA:  Neck trauma EXAM: CT CERVICAL SPINE WITHOUT CONTRAST TECHNIQUE: Multidetector CT imaging of the cervical spine was performed without intravenous contrast. Multiplanar CT image reconstructions were also generated. RADIATION DOSE REDUCTION: This exam was performed according to the departmental dose-optimization program which includes automated exposure control, adjustment of the mA and/or kV according to patient size and/or use of iterative reconstruction technique. COMPARISON:  None Available. FINDINGS: Alignment: Slight reversal of the normal lordotic curvature, likely degenerative and/or positional. Minimal grade 1 anterolisthesis of C3 on C4 and C7 on T1. Skull base and vertebrae: No acute fracture. No primary bone lesion or focal pathologic process. Soft tissues and spinal canal: No prevertebral fluid or swelling. No visible canal hematoma. Disc levels: Moderate to severe intervertebral disc space narrowing at C4-C5 and moderate narrowing at C5-C6 and C6-C7. Associated endplate sclerosis, dorsal endplate osteophytes and uncovertebral spurring. Facet arthropathy most significant on the right at C3-C4. Upper chest: No acute process identified. Mild biapical pleural thickening. Other: None. IMPRESSION: Degenerative changes with no acute fracture or malalignment identified. Electronically Signed   By: Ofilia Neas M.D.   On: 03/16/2022 21:20    CT Head Wo Contrast  Result Date: 03/16/2022 CLINICAL DATA:  Head trauma, fall EXAM: CT HEAD WITHOUT CONTRAST TECHNIQUE: Contiguous axial images were obtained from the base of the skull through the vertex without intravenous contrast. RADIATION DOSE REDUCTION: This exam was performed according to the departmental dose-optimization program which includes automated exposure control, adjustment of the mA and/or kV according to patient size and/or use of iterative reconstruction technique. COMPARISON:  None Available. FINDINGS: Brain: No acute intracranial hemorrhage, mass effect, or herniation. No extra-axial fluid collections. No evidence of acute territorial infarct. No hydrocephalus. Vascular: No hyperdense vessel or unexpected calcification. Skull: Normal. Negative for fracture or focal lesion. Sinuses/Orbits: No acute finding. Other: None. IMPRESSION: No acute intracranial process identified. Electronically Signed   By: Ofilia Neas M.D.   On: 03/16/2022 21:17    Procedures Procedures  {Document cardiac monitor, telemetry assessment procedure when appropriate:1}  Medications Ordered in ED Medications  oxyCODONE-acetaminophen (PERCOCET/ROXICET) 5-325 MG per tablet 1 tablet (1 tablet Oral Patient Refused/Not Given 03/16/22 2136)  ondansetron (ZOFRAN-ODT) disintegrating tablet 4 mg (4 mg Oral Patient Refused/Not Given 03/16/22 2136)  lidocaine (PF) (XYLOCAINE) 1 % injection 10 mL (10 mLs Infiltration Given by Other 03/16/22 2226)  HYDROmorphone (DILAUDID) injection 1 mg (1 mg Intramuscular Given 03/16/22 2223)  ondansetron (ZOFRAN-ODT) disintegrating tablet 4 mg (4 mg Oral Given 03/16/22 2307)    ED  Course/ Medical Decision Making/ A&P                           Medical Decision Making Amount and/or Complexity of Data Reviewed Radiology: ordered.  Risk Prescription drug management.   ***  {Document critical care time when appropriate:1} {Document review of labs and clinical decision tools  ie heart score, Chads2Vasc2 etc:1}  {Document your independent review of radiology images, and any outside records:1} {Document your discussion with family members, caretakers, and with consultants:1} {Document social determinants of health affecting pt's care:1} {Document your decision making why or why not admission, treatments were needed:1} Final Clinical Impression(s) / ED Diagnoses Final diagnoses:  None    Rx / DC Orders ED Discharge Orders     None

## 2022-03-16 NOTE — ED Notes (Signed)
Ortho Tech at [facility] called to come apply splint

## 2022-03-17 DIAGNOSIS — S52502A Unspecified fracture of the lower end of left radius, initial encounter for closed fracture: Secondary | ICD-10-CM | POA: Diagnosis not present

## 2022-03-17 DIAGNOSIS — Z7982 Long term (current) use of aspirin: Secondary | ICD-10-CM | POA: Diagnosis not present

## 2022-03-17 DIAGNOSIS — S022XXA Fracture of nasal bones, initial encounter for closed fracture: Secondary | ICD-10-CM | POA: Diagnosis not present

## 2022-03-17 MED ORDER — OXYCODONE-ACETAMINOPHEN 5-325 MG PO TABS: 1.0000 | ORAL_TABLET | Freq: Four times a day (QID) | ORAL | 0 refills | Status: DC | PRN

## 2022-03-17 NOTE — Progress Notes (Signed)
Orthopedic Tech Progress Note Patient Details:  Kari Marks 1961-03-12 [id number]  Ortho Devices Type of Ortho Device: Arm sling, Sugartong splint, Finger trap Finger Trap Weight: 5lb Ortho Device/Splint Location: lue Ortho Device/Splint Interventions: Ordered, Application, Adjustment  Plaster splint. Finger traps applied at drs request. Dr assisted with splint application. Dr molded splint Post Interventions Patient Tolerated: Well Instructions Provided: Care of device, Adjustment of device  [name] Post 03/17/2022, 12:02 AM

## 2022-03-18 ENCOUNTER — Other Ambulatory Visit: Payer: Self-pay | Admitting: Orthopedic Surgery

## 2022-03-18 ENCOUNTER — Encounter (HOSPITAL_BASED_OUTPATIENT_CLINIC_OR_DEPARTMENT_OTHER): Payer: Self-pay | Admitting: Orthopedic Surgery

## 2022-03-18 DIAGNOSIS — S52572A Other intraarticular fracture of lower end of left radius, initial encounter for closed fracture: Secondary | ICD-10-CM | POA: Diagnosis not present

## 2022-03-19 ENCOUNTER — Encounter (HOSPITAL_BASED_OUTPATIENT_CLINIC_OR_DEPARTMENT_OTHER): Payer: Self-pay | Admitting: Orthopedic Surgery

## 2022-03-19 ENCOUNTER — Ambulatory Visit (HOSPITAL_BASED_OUTPATIENT_CLINIC_OR_DEPARTMENT_OTHER): Payer: 59 | Admitting: Certified Registered"

## 2022-03-19 ENCOUNTER — Other Ambulatory Visit (HOSPITAL_COMMUNITY): Payer: Self-pay

## 2022-03-19 ENCOUNTER — Other Ambulatory Visit: Payer: Self-pay

## 2022-03-19 ENCOUNTER — Encounter (HOSPITAL_BASED_OUTPATIENT_CLINIC_OR_DEPARTMENT_OTHER)
Admission: RE | Disposition: A | Discharge status: Home / Self Care | Payer: Self-pay | Source: Home / Self Care | Attending: Orthopedic Surgery

## 2022-03-19 ENCOUNTER — Ambulatory Visit (HOSPITAL_BASED_OUTPATIENT_CLINIC_OR_DEPARTMENT_OTHER)
Admission: RE | Admit: 2022-03-19 | Discharge: 2022-03-19 | Disposition: A | Discharge status: Home / Self Care | Payer: 59 | Attending: Orthopedic Surgery | Admitting: Orthopedic Surgery

## 2022-03-19 DIAGNOSIS — Z87891 Personal history of nicotine dependence: Secondary | ICD-10-CM | POA: Insufficient documentation

## 2022-03-19 DIAGNOSIS — W1830XA Fall on same level, unspecified, initial encounter: Secondary | ICD-10-CM | POA: Diagnosis not present

## 2022-03-19 DIAGNOSIS — S52572A Other intraarticular fracture of lower end of left radius, initial encounter for closed fracture: Secondary | ICD-10-CM

## 2022-03-19 DIAGNOSIS — Z01818 Encounter for other preprocedural examination: Secondary | ICD-10-CM

## 2022-03-19 HISTORY — PX: FLEXOR TENOTOMY: SHX6342

## 2022-03-19 HISTORY — PX: OPEN REDUCTION INTERNAL FIXATION (ORIF) DISTAL RADIAL FRACTURE: SHX5989

## 2022-03-19 SURGERY — OPEN REDUCTION INTERNAL FIXATION (ORIF) DISTAL RADIUS FRACTURE
Anesthesia: Monitor Anesthesia Care | Site: Wrist | Laterality: Left

## 2022-03-19 MED ORDER — CEFAZOLIN SODIUM-DEXTROSE 2-4 GM/100ML-% IV SOLN
INTRAVENOUS | Status: AC
  Filled 2022-03-19: qty 100

## 2022-03-19 MED ORDER — FENTANYL CITRATE (PF) 100 MCG/2ML IJ SOLN: 25.0000 ug | INTRAMUSCULAR | Status: DC | PRN

## 2022-03-19 MED ORDER — FENTANYL CITRATE (PF) 100 MCG/2ML IJ SOLN
100.0000 ug | Freq: Once | INTRAMUSCULAR | Status: AC
  Administered 2022-03-19: 100 ug via INTRAVENOUS

## 2022-03-19 MED ORDER — ONDANSETRON HCL 4 MG/2ML IJ SOLN
INTRAMUSCULAR | Status: DC | PRN
  Administered 2022-03-19: 4 mg via INTRAVENOUS

## 2022-03-19 MED ORDER — GLYCOPYRROLATE PF 0.2 MG/ML IJ SOSY
PREFILLED_SYRINGE | INTRAMUSCULAR | Status: AC
  Filled 2022-03-19: qty 1

## 2022-03-19 MED ORDER — CLONIDINE HCL (ANALGESIA) 100 MCG/ML EP SOLN: EPIDURAL | Status: DC | PRN

## 2022-03-19 MED ORDER — CLONIDINE HCL (ANALGESIA) 100 MCG/ML EP SOLN
EPIDURAL | Status: DC | PRN
  Administered 2022-03-19: 100 ug

## 2022-03-19 MED ORDER — FENTANYL CITRATE (PF) 100 MCG/2ML IJ SOLN
INTRAMUSCULAR | Status: AC
  Filled 2022-03-19: qty 2

## 2022-03-19 MED ORDER — MIDAZOLAM HCL 2 MG/2ML IJ SOLN
2.0000 mg | Freq: Once | INTRAMUSCULAR | Status: AC
  Administered 2022-03-19: 2 mg via INTRAVENOUS

## 2022-03-19 MED ORDER — MEPERIDINE HCL 25 MG/ML IJ SOLN: 6.2500 mg | INTRAMUSCULAR | Status: DC | PRN

## 2022-03-19 MED ORDER — PROPOFOL 500 MG/50ML IV EMUL
INTRAVENOUS | Status: DC | PRN
  Administered 2022-03-19 (×2): 100 ug/kg/min via INTRAVENOUS

## 2022-03-19 MED ORDER — CEFAZOLIN SODIUM-DEXTROSE 2-4 GM/100ML-% IV SOLN
2.0000 g | INTRAVENOUS | Status: AC
  Administered 2022-03-19: 2 g via INTRAVENOUS

## 2022-03-19 MED ORDER — ONDANSETRON HCL 4 MG/2ML IJ SOLN: 4.0000 mg | Freq: Once | INTRAMUSCULAR | Status: DC | PRN

## 2022-03-19 MED ORDER — GLYCOPYRROLATE 0.2 MG/ML IJ SOLN
INTRAMUSCULAR | Status: DC | PRN
  Administered 2022-03-19 (×2): .1 mg via INTRAVENOUS

## 2022-03-19 MED ORDER — ROPIVACAINE HCL 5 MG/ML IJ SOLN
INTRAMUSCULAR | Status: DC | PRN
  Administered 2022-03-19 (×4): 5 mL via PERINEURAL

## 2022-03-19 MED ORDER — ONDANSETRON HCL 4 MG/2ML IJ SOLN
INTRAMUSCULAR | Status: AC
  Filled 2022-03-19: qty 2

## 2022-03-19 MED ORDER — PROPOFOL 10 MG/ML IV BOLUS
INTRAVENOUS | Status: DC | PRN
  Administered 2022-03-19: 30 mg via INTRAVENOUS
  Administered 2022-03-19: 20 mg via INTRAVENOUS

## 2022-03-19 MED ORDER — MIDAZOLAM HCL 2 MG/2ML IJ SOLN
INTRAMUSCULAR | Status: AC
  Filled 2022-03-19: qty 2

## 2022-03-19 MED ORDER — LACTATED RINGERS IV SOLN: INTRAVENOUS | Status: DC

## 2022-03-19 MED ORDER — ACETAMINOPHEN 325 MG PO TABS: 325.0000 mg | ORAL_TABLET | ORAL | Status: DC | PRN

## 2022-03-19 MED ORDER — DEXAMETHASONE SODIUM PHOSPHATE 10 MG/ML IJ SOLN
INTRAMUSCULAR | Status: DC | PRN
  Administered 2022-03-19: 10 mg

## 2022-03-19 MED ORDER — KETOROLAC TROMETHAMINE 30 MG/ML IJ SOLN: 30.0000 mg | Freq: Once | INTRAMUSCULAR | Status: DC | PRN

## 2022-03-19 MED ORDER — OXYCODONE-ACETAMINOPHEN 5-325 MG PO TABS
ORAL_TABLET | ORAL | 0 refills | Status: AC
  Filled 2022-03-19: qty 20, 3d supply, fill #0

## 2022-03-19 MED ORDER — ACETAMINOPHEN 160 MG/5ML PO SOLN: 325.0000 mg | ORAL | Status: DC | PRN

## 2022-03-19 SURGICAL SUPPLY — 62 items
APL PRP STRL LF DISP 70% ISPRP (MISCELLANEOUS) ×1
BIT DRILL 2.0 LNG QUCK RELEASE (BIT) IMPLANT
BIT DRILL QC 2.8X5 (BIT) ×1 IMPLANT
BLADE SURG 15 STRL LF DISP TIS (BLADE) ×2 IMPLANT
BLADE SURG 15 STRL SS (BLADE) ×4
BNDG CMPR 9X4 STRL LF SNTH (GAUZE/BANDAGES/DRESSINGS) ×1
BNDG ELASTIC 3X5.8 VLCR STR LF (GAUZE/BANDAGES/DRESSINGS) ×2 IMPLANT
BNDG ESMARK 4X9 LF (GAUZE/BANDAGES/DRESSINGS) ×2 IMPLANT
BNDG GAUZE DERMACEA FLUFF (GAUZE/BANDAGES/DRESSINGS) ×1
BNDG GAUZE DERMACEA FLUFF 4 (GAUZE/BANDAGES/DRESSINGS) ×1 IMPLANT
BNDG GZE DERMACEA 4 6PLY (GAUZE/BANDAGES/DRESSINGS) ×1
BNDG PLASTER X FAST 3X3 WHT LF (CAST SUPPLIES) ×20 IMPLANT
BNDG PLSTR 9X3 FST ST WHT (CAST SUPPLIES) ×10
CHLORAPREP W/TINT 26 (MISCELLANEOUS) ×2 IMPLANT
CORD BIPOLAR FORCEPS 12FT (ELECTRODE) ×2 IMPLANT
COVER BACK TABLE 60X90IN (DRAPES) ×2 IMPLANT
COVER MAYO STAND STRL (DRAPES) ×2 IMPLANT
CUFF TOURN SGL QUICK 18X4 (TOURNIQUET CUFF) IMPLANT
CUFF TOURN SGL QUICK 24 (TOURNIQUET CUFF)
CUFF TRNQT CYL 24X4X16.5-23 (TOURNIQUET CUFF) IMPLANT
DRAPE EXTREMITY T 121X128X90 (DISPOSABLE) ×2 IMPLANT
DRAPE OEC MINIVIEW 54X84 (DRAPES) ×2 IMPLANT
DRAPE SURG 17X23 STRL (DRAPES) ×2 IMPLANT
DRILL 2.0 LNG QUICK RELEASE (BIT) ×2
GAUZE SPONGE 4X4 12PLY STRL (GAUZE/BANDAGES/DRESSINGS) ×2 IMPLANT
GAUZE XEROFORM 1X8 LF (GAUZE/BANDAGES/DRESSINGS) ×2 IMPLANT
GLOVE BIO SURGEON STRL SZ7.5 (GLOVE) ×2 IMPLANT
GLOVE BIOGEL PI IND STRL 8 (GLOVE) ×1 IMPLANT
GLOVE BIOGEL PI IND STRL 8.5 (GLOVE) IMPLANT
GLOVE BIOGEL PI INDICATOR 8 (GLOVE) ×1
GLOVE BIOGEL PI INDICATOR 8.5 (GLOVE)
GLOVE SURG ORTHO 8.0 STRL STRW (GLOVE) IMPLANT
GOWN STRL REUS W/ TWL LRG LVL3 (GOWN DISPOSABLE) ×1 IMPLANT
GOWN STRL REUS W/TWL LRG LVL3 (GOWN DISPOSABLE) ×2
GOWN STRL REUS W/TWL XL LVL3 (GOWN DISPOSABLE) ×2 IMPLANT
GUIDEWIRE ORTHO 0.054X6 (WIRE) ×3 IMPLANT
NDL HYPO 25X1 1.5 SAFETY (NEEDLE) IMPLANT
NEEDLE HYPO 25X1 1.5 SAFETY (NEEDLE) IMPLANT
NS IRRIG 1000ML POUR BTL (IV SOLUTION) ×2 IMPLANT
PACK BASIN DAY SURGERY FS (CUSTOM PROCEDURE TRAY) ×2 IMPLANT
PAD CAST 3X4 CTTN HI CHSV (CAST SUPPLIES) ×1 IMPLANT
PADDING CAST COTTON 3X4 STRL (CAST SUPPLIES) ×2
PLATE PROX NARROW LEFT (Plate) ×1 IMPLANT
SCREW CORT FT 18X2.3XLCK HEX (Screw) IMPLANT
SCREW CORT FX14X2.3XLCK NS (Screw) IMPLANT
SCREW CORTICAL LOCKING 2.3X14M (Screw) ×4 IMPLANT
SCREW CORTICAL LOCKING 2.3X16M (Screw) ×2 IMPLANT
SCREW CORTICAL LOCKING 2.3X18M (Screw) ×6 IMPLANT
SCREW FX16X2.3XLCK SMTH NS CRT (Screw) IMPLANT
SCREW FX18X2.3XSMTH LCK NS CRT (Screw) IMPLANT
SCREW NLCKG 13 3.5X13 HEXA (Screw) IMPLANT
SCREW NON LOCK 3.5X10MM (Screw) ×1 IMPLANT
SCREW NON-LOCK 3.5X13 (Screw) ×2 IMPLANT
SCREW NONLOCK HEX 3.5X12 (Screw) ×1 IMPLANT
SLEEVE SCD COMPRESS KNEE MED (STOCKING) IMPLANT
STOCKINETTE 4X48 STRL (DRAPES) ×2 IMPLANT
SUT ETHILON 4 0 PS 2 18 (SUTURE) ×2 IMPLANT
SUT VICRYL 4-0 PS2 18IN ABS (SUTURE) ×2 IMPLANT
SYR BULB EAR ULCER 3OZ GRN STR (SYRINGE) ×2 IMPLANT
SYR CONTROL 10ML LL (SYRINGE) IMPLANT
TOWEL GREEN STERILE FF (TOWEL DISPOSABLE) ×4 IMPLANT
UNDERPAD 30X36 HEAVY ABSORB (UNDERPADS AND DIAPERS) ×2 IMPLANT

## 2022-03-19 NOTE — Discharge Instructions (Addendum)
  Post Anesthesia Home Care Instructions  Activity: Get plenty of rest for the remainder of the day. A responsible individual must stay with you for 24 hours following the procedure.  For the next 24 hours, DO NOT: -Drive a car -Operate machinery -Drink alcoholic beverages -Take any medication unless instructed by your physician -Make any legal decisions or sign important papers.  Meals: Start with liquid foods such as gelatin or soup. Progress to regular foods as tolerated. Avoid greasy, spicy, heavy foods. If nausea and/or vomiting occur, drink only clear liquids until the nausea and/or vomiting subsides. Call your physician if vomiting continues.  Special Instructions/Symptoms: Your throat may feel dry or sore from the anesthesia or the breathing tube placed in your throat during surgery. If this causes discomfort, gargle with warm salt water. The discomfort should disappear within 24 hours.  Regional Anesthesia Blocks  1. Numbness or the inability to move the "blocked" extremity may last from 3-48 hours after placement. The length of time depends on the medication injected and your individual response to the medication. If the numbness is not going away after 48 hours, call your surgeon.  2. The extremity that is blocked will need to be protected until the numbness is gone and the  Strength has returned. Because you cannot feel it, you will need to take extra care to avoid injury. Because it may be weak, you may have difficulty moving it or using it. You may not know what position it is in without looking at it while the block is in effect.  3. For blocks in the legs and feet, returning to weight bearing and walking needs to be done carefully. You will need to wait until the numbness is entirely gone and the strength has returned. You should be able to move your leg and foot normally before you try and bear weight or walk. You will need someone to be with you when you first try to ensure  you do not fall and possibly risk injury.  4. Bruising and tenderness at the needle site are common side effects and will resolve in a few days.  5. Persistent numbness or new problems with movement should be communicated to the surgeon or the [facility] ([phone])/  Surgery Center ([phone]). Hand Center Instructions Hand Surgery  Wound Care: Keep your hand elevated above the level of your heart.  Do not allow it to dangle by your side.  Keep the dressing dry and do not remove it unless your doctor advises you to do so.  He will usually change it at the time of your post-op visit.  Moving your fingers is advised to stimulate circulation but will depend on the site of your surgery.  If you have a splint applied, your doctor will advise you regarding movement.  Activity: Do not drive or operate machinery today.  Rest today and then you may return to your normal activity and work as indicated by your physician.  Diet:  Drink liquids today or eat a light diet.  You may resume a regular diet tomorrow.    General expectations: Pain for two to three days. Fingers may become slightly swollen.  Call your doctor if any of the following occur: Severe pain not relieved by pain medication. Elevated temperature. Dressing soaked with blood. Inability to move fingers. White or bluish color to fingers.  

## 2022-03-19 NOTE — Transfer of Care (Signed)
Immediate Anesthesia Transfer of Care Note  Patient: [place] Bellows  Procedure(s) Performed: OPEN REDUCTION INTERNAL FIXATION (ORIF) LEFT DISTAL RADIUS FRACTURE, POSSIBLE BRIDGE PLATE (Left: Wrist) LEFT BRACHIORADIALIS RELEASE (Left: Wrist)  Patient Location: PACU  Anesthesia Type:MAC combined with regional for post-op pain  Level of Consciousness: drowsy  Airway & Oxygen Therapy: Patient Spontanous Breathing and Patient connected to face mask oxygen  Post-op Assessment: Report given to RN and Post -op Vital signs reviewed and stable  Post vital signs: Reviewed and stable  Last Vitals:  BP: 139/87 (102) Vitals Value Taken Time  BP    Temp    Pulse 90 03/19/22 1530  Resp 17 03/19/22 1530  SpO2 95 % 03/19/22 1530  Vitals shown include unvalidated device data.  Last Pain:  Vitals:   03/19/22 1151  TempSrc: Oral  PainSc: 3          Complications: No notable events documented.

## 2022-03-19 NOTE — Anesthesia Procedure Notes (Signed)
Anesthesia Regional Block: Supraclavicular block   Pre-Anesthetic Checklist: , timeout performed,  Correct Patient, Correct Site, Correct Laterality,  Correct Procedure, Correct Position, site marked,  Risks and benefits discussed,  Surgical consent,  Pre-op evaluation,  At surgeon's request and post-op pain management  Laterality: Upper and Left  Prep: chloraprep       Needles:  Injection technique: Single-shot  Needle Type: Echogenic Stimulator Needle     Needle Length: 9cm  Needle Gauge: 20   Needle insertion depth: 1.5 cm   Additional Needles:   Procedures:,,,, ultrasound used (permanent image in chart),,    Narrative:  Start time: 03/19/2022 1:45 PM End time: 03/19/2022 1:54 PM Injection made incrementally with aspirations every 5 mL.  Performed by: Personally  Anesthesiologist: Leilani Able, MD

## 2022-03-19 NOTE — Anesthesia Preprocedure Evaluation (Signed)
Anesthesia Evaluation  Patient identified by MRN, date of birth, ID band Patient awake    Reviewed: Allergy & Precautions, NPO status , Patient's Chart, lab work & pertinent test results  Airway Mallampati: I       Dental no notable dental hx. (+) Teeth Intact   Pulmonary neg pulmonary ROS, former smoker,    Pulmonary exam normal        Cardiovascular negative cardio ROS Normal cardiovascular exam     Neuro/Psych negative neurological ROS  negative psych ROS   GI/Hepatic negative GI ROS, Neg liver ROS,   Endo/Other    Renal/GU negative Renal ROS  negative genitourinary   Musculoskeletal negative musculoskeletal ROS (+)   Abdominal Normal abdominal exam  (+)   Peds  Hematology   Anesthesia Other Findings   Reproductive/Obstetrics                             Anesthesia Physical Anesthesia Plan  ASA: 1  Anesthesia Plan: MAC and Regional   Post-op Pain Management: Regional block*   Induction:   PONV Risk Score and Plan: 2 and Propofol infusion, Midazolam and TIVA  Airway Management Planned: Natural Airway and Simple Face Mask  Additional Equipment: None  Intra-op Plan:   Post-operative Plan:   Informed Consent: I have reviewed the patients History and Physical, chart, labs and discussed the procedure including the risks, benefits and alternatives for the proposed anesthesia with the patient or authorized representative who has indicated his/her understanding and acceptance.     Dental advisory given  Plan Discussed with: CRNA  Anesthesia Plan Comments:         Anesthesia Quick Evaluation

## 2022-03-19 NOTE — Anesthesia Postprocedure Evaluation (Signed)
Anesthesia Post Note  Patient: Kari Marks  Procedure(s) Performed: OPEN REDUCTION INTERNAL FIXATION (ORIF) LEFT DISTAL RADIUS FRACTURE, POSSIBLE BRIDGE PLATE (Left: Wrist) LEFT BRACHIORADIALIS RELEASE (Left: Wrist)     Patient location during evaluation: Phase II Anesthesia Type: Regional and MAC Level of consciousness: awake Pain management: pain level controlled Vital Signs Assessment: post-procedure vital signs reviewed and stable Respiratory status: spontaneous breathing Cardiovascular status: stable Postop Assessment: no apparent nausea or vomiting Anesthetic complications: no   No notable events documented.  Last Vitals:  Vitals:   03/19/22 1545 03/19/22 1600  BP: 117/88 128/77  Pulse: (!) 54 (!) 53  Resp: 18 16  Temp:    SpO2: 97% 94%    Last Pain:  Vitals:   03/19/22 1545  TempSrc:   PainSc: 0-No pain                 Huston Foley

## 2022-03-19 NOTE — Op Note (Addendum)
03/19/2022 [facility]  Operative Note  Pre Op Diagnosis: Left intraarticular distal radius fracture  Post Op Diagnosis: Left intraarticular distal radius fracture  Procedure:  ORIF left intraarticular distal radius fracture, 2 intraarticular fragments Left brachioradialis release  Surgeon: Betha Loa, MD  Assistant: Cindee Salt, MD  Anesthesia: Regional with sedation  Fluids: Per anesthesia flow sheet  EBL: minimal  Complications: None  Specimen: None  Tourniquet Time:  Total Tourniquet Time Documented: Upper Arm (Left) - 41 minutes Total: Upper Arm (Left) - 41 minutes   Disposition: Stable to PACU  INDICATIONS:  Kari Marks is a 61 y.o. female states she fell from standing height over the weekend injuring left wrist.  Seen at [facility] where XR revealed distal radius fracture.  Splinted and followed up in office.  We discussed nonoperative and operative treatment options.  She wished to proceed with operative fixation.  Risks, benefits, and alternatives of surgery were discussed including the risk of blood loss; infection; damage to nerves, vessels, tendons, ligaments, bone; failure of surgery; need for additional surgery; complications with wound healing; continued pain; nonunion; malunion; stiffness.  We also discussed the possible need for bone graft and the benefits and risks including the possibility of disease transmission.  She voiced understanding of these risks and elected to proceed.    OPERATIVE COURSE:  After being identified preoperatively by myself, the patient and I agreed upon the procedure and site of procedure.  Surgical site was marked.   Surgical consent had been signed.  She was given IV Ancef as preoperative antibiotic prophylaxis.  She was transferred to the operating room and placed on the operating room table in supine position with the left upper extremity on an armboard. Sedation was induced by the anesthesiologist.  A regional block  had been performed by anesthesia in preoperative holding.  The left upper extremity was prepped and draped in normal sterile orthopedic fashion.  A surgical pause was performed between the surgeons, anesthesia and operating room staff, and all were in agreement as to the patient, procedure and site of procedure.  Tourniquet at the proximal aspect of the extremity was inflated to 250 mmHg after exsanguination of the limb with an Esmarch bandage.  Standard volar Sherilyn Cooter approach was used.  The bipolar electrocautery was used to obtain hemostasis.  The superficial and deep portions of the FCR tendon sheath were incised, and the FCR and FPL were swept ulnarly to protect the palmar cutaneous branch of the median nerve.  The brachioradialis was released at the radial side of the radius.  The pronator quadratus was released and elevated with the periosteal elevator.  The fracture site was identified and cleared of soft tissue interposition and hematoma.  It was reduced under direct visualization.  There was intraarticular extension creating two intraarticular fragments.  An AcuMed volar distal radial locking plate was selected.  It was secured to the bone with the guidepins.  C-arm was used in AP and lateral projections to ensure appropriate reduction and position of the hardware and adjustments made as necessary.  Standard AO drilling and measuring technique was used.  A single screw was placed in the slotted hole in the shaft of the plate.  The distal holes were filled with locking pegs with the exception of the styloid holes, which were filled with locking screws.  The remaining holes in the shaft of the plate were filled with nonlocking screws.  Good purchase was obtained.  C-arm was used in AP, lateral and oblique  projections to ensure appropriate reduction and position of hardware, which was the case.  There was no intra-articular penetration of hardware.  The wound was copiously irrigated with sterile saline.   Pronator quadratus was repaired back over top of the plate using 4-0 Vicryl suture.  Vicryl suture was placed in the subcutaneous tissues in an inverted interrupted fashion and the skin was closed with 4-0 nylon in a horizontal mattress fashion.  There was good pronation and supination of the wrist without crepitance.  The wound was then dressed with sterile Xeroform, 4x4s, and wrapped with a Kerlix bandage.  A volar splint was placed and wrapped with Kerlix and Ace bandage.  Tourniquet was deflated at 41 minutes.  Fingertips were pink with brisk capillary refill after deflation of the tourniquet.  Operative drapes were broken down.  The patient was awoken from anesthesia safely.  She was transferred back to the stretcher and taken to the PACU in stable condition.  I will see her back in the office in one week for postoperative followup.  I will give her a prescription for Percocet 5/325 1-2 tabs PO q6 hours prn pain, dispense # 20.    Betha Loa, MD Electronically signed, 03/19/22

## 2022-03-19 NOTE — H&P (Signed)
Kari Marks is an 61 y.o. female.   Chief Complaint: wrist fracture HPI: 61 yo female states she fell from standing height March 16, 2022 injuring right wrist.  Seen at [facility] where XR revealed distal radius fracture.  Splinted and followed up in office.  She wishes to proceed with operative reduction and fixation.  Allergies:  Allergies  Allergen Reactions   Dilaudid [Hydromorphone] Other (See Comments)    Patient felt awful, confused, paranoid, lightheaded    Latex Itching and Rash    History reviewed. No pertinent past medical history.  Past Surgical History:  Procedure Laterality Date   ABDOMINOPLASTY     BREAST ENHANCEMENT SURGERY     2004   LEG SURGERY Right    [facility]    Family History: History reviewed. No pertinent family history.  Social History:   reports that she has quit smoking. She does not have any smokeless tobacco history on file. She reports current alcohol use. She reports that she does not use drugs.  Medications: Medications Prior to Admission  Medication Sig Dispense Refill   acetaminophen (TYLENOL) 325 MG tablet Take 500 mg by mouth every 6 (six) hours as needed.     ibuprofen (ADVIL) 200 MG tablet Take 200 mg by mouth every 6 (six) hours as needed.     Blood Pressure Monitoring (OMRON 3 SERIES BP MONITOR) DEVI Use as directed 1 each 0   oxyCODONE-acetaminophen (PERCOCET/ROXICET) 5-325 MG tablet Take 1 tablet by mouth every 4 (four) hours as needed for up to 20 doses. 20 tablet 0   oxyCODONE-acetaminophen (PERCOCET/ROXICET) 5-325 MG tablet Take 1 tablet by mouth every 6 (six) hours as needed for severe pain. 12 tablet 0    No results found for this or any previous visit (from the past 48 hour(s)).  No results found.    Blood pressure (!) 151/80, pulse 73, temperature 97.6 F (36.4 C), temperature source Oral, resp. rate 20, height 5\' 4"  (1.626 m), weight 68.1 kg, SpO2 97 %.  General appearance: alert, cooperative, and appears stated age Head:  Normocephalic, without obvious abnormality, atraumatic Neck: supple, symmetrical, trachea midline Extremities: Intact sensation and capillary refill all digits.  +epl/fpl/io.   Pulses: 2+ and symmetric Skin: Skin color, texture, turgor normal. No rashes or lesions Neurologic: Grossly normal Incision/Wound: none  Assessment/Plan Right distal radius fracture.  Non operative and operative treatment options have been discussed with the patient and patient wishes to proceed with operative treatment. Risks, benefits, and alternatives of surgery have been discussed and the patient agrees with the plan of care.   03/19/2022, 12:54 PM

## 2022-03-19 NOTE — Progress Notes (Signed)
Assisted Dr. Arby Barrette with left, supraclavicular, ultrasound guided block. Side rails up, monitors on throughout procedure. See vital signs in flow sheet. Tolerated Procedure well.

## 2022-03-19 NOTE — Op Note (Signed)
I assisted Surgeon(s) and Role:    * Betha Loa, MD - Primary    Cindee Salt, MD - Assisting on the Procedure(s): OPEN REDUCTION INTERNAL FIXATION (ORIF) LEFT DISTAL RADIUS FRACTURE, POSSIBLE BRIDGE PLATE LEFT BRACHIORADIALIS RELEASE on 03/19/2022.  I provided assistance on this case as follows: Set up, approach, retraction, identification of the fracture, release of the brachial radialis, debridement reduction stabilization and fixation of the distal radius fracture with volar plate and screws, Lausier of the wound and application of the dressing and splint.  Electronically signed by: Cindee Salt, MD Date: 03/19/2022 Time: 3:25 PM

## 2022-03-20 ENCOUNTER — Encounter (HOSPITAL_BASED_OUTPATIENT_CLINIC_OR_DEPARTMENT_OTHER): Payer: Self-pay | Admitting: Orthopedic Surgery

## 2022-03-21 DIAGNOSIS — S022XXA Fracture of nasal bones, initial encounter for closed fracture: Secondary | ICD-10-CM | POA: Diagnosis not present

## 2022-03-25 DIAGNOSIS — Z4889 Encounter for other specified surgical aftercare: Secondary | ICD-10-CM | POA: Diagnosis not present

## 2022-03-25 DIAGNOSIS — S52572A Other intraarticular fracture of lower end of left radius, initial encounter for closed fracture: Secondary | ICD-10-CM | POA: Diagnosis not present

## 2022-03-25 DIAGNOSIS — M25632 Stiffness of left wrist, not elsewhere classified: Secondary | ICD-10-CM | POA: Diagnosis not present

## 2022-03-25 DIAGNOSIS — S022XXD Fracture of nasal bones, subsequent encounter for fracture with routine healing: Secondary | ICD-10-CM | POA: Diagnosis not present

## 2022-03-25 DIAGNOSIS — M25532 Pain in left wrist: Secondary | ICD-10-CM | POA: Diagnosis not present

## 2022-03-25 DIAGNOSIS — M25432 Effusion, left wrist: Secondary | ICD-10-CM | POA: Diagnosis not present

## 2022-04-02 DIAGNOSIS — Z4889 Encounter for other specified surgical aftercare: Secondary | ICD-10-CM | POA: Diagnosis not present

## 2022-04-02 DIAGNOSIS — S52572A Other intraarticular fracture of lower end of left radius, initial encounter for closed fracture: Secondary | ICD-10-CM | POA: Diagnosis not present

## 2022-04-02 DIAGNOSIS — M25532 Pain in left wrist: Secondary | ICD-10-CM | POA: Diagnosis not present

## 2022-04-02 DIAGNOSIS — M25432 Effusion, left wrist: Secondary | ICD-10-CM | POA: Diagnosis not present

## 2022-04-02 DIAGNOSIS — S022XXD Fracture of nasal bones, subsequent encounter for fracture with routine healing: Secondary | ICD-10-CM | POA: Diagnosis not present

## 2022-04-02 DIAGNOSIS — M25632 Stiffness of left wrist, not elsewhere classified: Secondary | ICD-10-CM | POA: Diagnosis not present

## 2022-05-03 ENCOUNTER — Other Ambulatory Visit (HOSPITAL_COMMUNITY): Payer: Self-pay

## 2022-05-03 DIAGNOSIS — E559 Vitamin D deficiency, unspecified: Secondary | ICD-10-CM | POA: Diagnosis not present

## 2022-05-03 DIAGNOSIS — Z780 Asymptomatic menopausal state: Secondary | ICD-10-CM | POA: Diagnosis not present

## 2022-05-03 DIAGNOSIS — Z8781 Personal history of (healed) traumatic fracture: Secondary | ICD-10-CM | POA: Diagnosis not present

## 2022-05-03 DIAGNOSIS — Z0000 Encounter for general adult medical examination without abnormal findings: Secondary | ICD-10-CM | POA: Diagnosis not present

## 2022-05-03 DIAGNOSIS — Z8269 Family history of other diseases of the musculoskeletal system and connective tissue: Secondary | ICD-10-CM | POA: Diagnosis not present

## 2022-05-03 DIAGNOSIS — Z87891 Personal history of nicotine dependence: Secondary | ICD-10-CM | POA: Diagnosis not present

## 2022-05-03 DIAGNOSIS — E7800 Pure hypercholesterolemia, unspecified: Secondary | ICD-10-CM | POA: Diagnosis not present

## 2022-05-03 MED ORDER — ROSUVASTATIN CALCIUM 10 MG PO TABS
ORAL_TABLET | ORAL | 3 refills | Status: AC
  Filled 2022-05-03 – 2022-07-08 (×2): qty 90, 90d supply, fill #0
  Filled 2022-10-03: qty 90, 90d supply, fill #1
  Filled 2023-01-06 – 2023-01-22 (×2): qty 90, 90d supply, fill #2

## 2022-05-14 DIAGNOSIS — S52572A Other intraarticular fracture of lower end of left radius, initial encounter for closed fracture: Secondary | ICD-10-CM | POA: Diagnosis not present

## 2022-07-01 ENCOUNTER — Other Ambulatory Visit (HOSPITAL_BASED_OUTPATIENT_CLINIC_OR_DEPARTMENT_OTHER): Payer: Self-pay

## 2022-07-01 MED ORDER — COVID-19 MRNA VAC-TRIS(PFIZER) 30 MCG/0.3ML IM SUSY
PREFILLED_SYRINGE | INTRAMUSCULAR | 0 refills | Status: AC
  Filled 2022-07-01: qty 0.3, 1d supply, fill #0

## 2022-07-01 MED ORDER — INFLUENZA VAC SPLIT QUAD 0.5 ML IM SUSY
PREFILLED_SYRINGE | INTRAMUSCULAR | 0 refills | Status: AC
  Filled 2022-07-01: qty 0.5, 1d supply, fill #0

## 2022-07-08 ENCOUNTER — Other Ambulatory Visit (HOSPITAL_COMMUNITY): Payer: Self-pay

## 2022-08-04 DIAGNOSIS — B349 Viral infection, unspecified: Secondary | ICD-10-CM | POA: Diagnosis not present

## 2022-08-04 DIAGNOSIS — R051 Acute cough: Secondary | ICD-10-CM | POA: Diagnosis not present

## 2022-08-04 DIAGNOSIS — J029 Acute pharyngitis, unspecified: Secondary | ICD-10-CM | POA: Diagnosis not present

## 2022-08-04 DIAGNOSIS — R509 Fever, unspecified: Secondary | ICD-10-CM | POA: Diagnosis not present

## 2022-08-04 DIAGNOSIS — Z03818 Encounter for observation for suspected exposure to other biological agents ruled out: Secondary | ICD-10-CM | POA: Diagnosis not present

## 2022-08-08 DIAGNOSIS — R051 Acute cough: Secondary | ICD-10-CM | POA: Diagnosis not present

## 2022-08-08 DIAGNOSIS — B9689 Other specified bacterial agents as the cause of diseases classified elsewhere: Secondary | ICD-10-CM | POA: Diagnosis not present

## 2022-08-08 DIAGNOSIS — J019 Acute sinusitis, unspecified: Secondary | ICD-10-CM | POA: Diagnosis not present

## 2022-10-11 ENCOUNTER — Other Ambulatory Visit (HOSPITAL_COMMUNITY): Payer: Self-pay

## 2022-11-29 ENCOUNTER — Other Ambulatory Visit (HOSPITAL_COMMUNITY): Payer: Self-pay

## 2022-11-29 DIAGNOSIS — Z1231 Encounter for screening mammogram for malignant neoplasm of breast: Secondary | ICD-10-CM | POA: Diagnosis not present

## 2022-11-29 DIAGNOSIS — Z01419 Encounter for gynecological examination (general) (routine) without abnormal findings: Secondary | ICD-10-CM | POA: Diagnosis not present

## 2022-11-29 MED ORDER — ESTRADIOL 10 MCG VA TABS
ORAL_TABLET | VAGINAL | 3 refills | Status: DC
  Filled 2022-11-29 – 2022-11-30 (×2): qty 18, 30d supply, fill #0

## 2022-11-30 ENCOUNTER — Other Ambulatory Visit (HOSPITAL_COMMUNITY): Payer: Self-pay

## 2022-12-05 ENCOUNTER — Other Ambulatory Visit (HOSPITAL_COMMUNITY): Payer: Self-pay

## 2022-12-05 MED ORDER — ESTRADIOL 0.1 MG/GM VA CREA
TOPICAL_CREAM | VAGINAL | 1 refills | Status: AC
Start: 2022-12-05 — End: ?
  Filled 2022-12-05: qty 42.5, 30d supply, fill #0
  Filled 2023-01-22: qty 42.5, 30d supply, fill #1

## 2022-12-14 ENCOUNTER — Other Ambulatory Visit (HOSPITAL_COMMUNITY): Payer: Self-pay

## 2023-01-14 ENCOUNTER — Other Ambulatory Visit (HOSPITAL_COMMUNITY): Payer: Self-pay

## 2023-01-22 ENCOUNTER — Other Ambulatory Visit (HOSPITAL_COMMUNITY): Payer: Self-pay

## 2023-01-23 ENCOUNTER — Other Ambulatory Visit (HOSPITAL_COMMUNITY): Payer: Self-pay

## 2023-05-02 ENCOUNTER — Other Ambulatory Visit (HOSPITAL_COMMUNITY): Payer: Self-pay

## 2023-05-02 MED ORDER — CLOBETASOL PROPIONATE 0.05 % EX CREA
TOPICAL_CREAM | CUTANEOUS | 0 refills | Status: AC
Start: 2023-05-02 — End: ?
  Filled 2023-05-02: qty 30, 10d supply, fill #0

## 2023-05-02 MED ORDER — PREDNISONE 10 MG PO TABS
ORAL_TABLET | ORAL | 0 refills | Status: AC
Start: 2023-05-02 — End: ?
  Filled 2023-05-02: qty 30, 12d supply, fill #0

## 2023-05-03 ENCOUNTER — Other Ambulatory Visit (HOSPITAL_COMMUNITY): Payer: Self-pay

## 2023-05-05 ENCOUNTER — Other Ambulatory Visit (HOSPITAL_COMMUNITY): Payer: Self-pay

## 2023-05-09 DIAGNOSIS — A6000 Herpesviral infection of urogenital system, unspecified: Secondary | ICD-10-CM | POA: Diagnosis not present

## 2023-05-09 DIAGNOSIS — E7800 Pure hypercholesterolemia, unspecified: Secondary | ICD-10-CM | POA: Diagnosis not present

## 2023-05-09 DIAGNOSIS — Z0000 Encounter for general adult medical examination without abnormal findings: Secondary | ICD-10-CM | POA: Diagnosis not present

## 2023-05-09 DIAGNOSIS — E559 Vitamin D deficiency, unspecified: Secondary | ICD-10-CM | POA: Diagnosis not present

## 2023-05-09 DIAGNOSIS — M5136 Other intervertebral disc degeneration, lumbar region: Secondary | ICD-10-CM | POA: Diagnosis not present

## 2023-05-09 DIAGNOSIS — M25551 Pain in right hip: Secondary | ICD-10-CM | POA: Diagnosis not present

## 2023-06-02 ENCOUNTER — Other Ambulatory Visit (HOSPITAL_COMMUNITY): Payer: Self-pay

## 2023-06-02 MED ORDER — AZITHROMYCIN 250 MG PO TABS
ORAL_TABLET | ORAL | 0 refills | Status: AC
  Filled 2023-06-02: qty 6, 5d supply, fill #0

## 2023-06-03 ENCOUNTER — Other Ambulatory Visit (HOSPITAL_COMMUNITY): Payer: Self-pay

## 2023-06-04 ENCOUNTER — Other Ambulatory Visit (HOSPITAL_COMMUNITY): Payer: Self-pay

## 2023-07-29 ENCOUNTER — Other Ambulatory Visit (HOSPITAL_COMMUNITY): Payer: Self-pay

## 2024-01-27 ENCOUNTER — Other Ambulatory Visit (HOSPITAL_COMMUNITY): Payer: Self-pay

## 2024-01-27 MED ORDER — CLOBETASOL PROPIONATE 0.05 % EX CREA
TOPICAL_CREAM | CUTANEOUS | 0 refills | Status: AC
  Filled 2024-01-27: qty 15, 10d supply, fill #0

## 2024-01-29 ENCOUNTER — Other Ambulatory Visit (HOSPITAL_COMMUNITY): Payer: Self-pay

## 2024-01-30 ENCOUNTER — Other Ambulatory Visit (HOSPITAL_COMMUNITY): Payer: Self-pay

## 2024-01-30 MED ORDER — CLOBETASOL PROPIONATE 0.05 % EX CREA
TOPICAL_CREAM | CUTANEOUS | 0 refills | Status: AC
  Filled 2024-01-30: qty 15, 10d supply, fill #0

## 2024-03-08 ENCOUNTER — Other Ambulatory Visit (HOSPITAL_COMMUNITY): Payer: Self-pay

## 2024-08-02 ENCOUNTER — Other Ambulatory Visit (HOSPITAL_COMMUNITY): Payer: Self-pay

## 2024-08-02 DIAGNOSIS — R399 Unspecified symptoms and signs involving the genitourinary system: Secondary | ICD-10-CM | POA: Diagnosis not present

## 2024-08-02 DIAGNOSIS — N390 Urinary tract infection, site not specified: Secondary | ICD-10-CM | POA: Diagnosis not present

## 2024-08-02 MED ORDER — CEPHALEXIN 500 MG PO CAPS
500.0000 mg | ORAL_CAPSULE | Freq: Four times a day (QID) | ORAL | 0 refills | Status: AC
  Filled 2024-08-02: qty 28, 7d supply, fill #0

## 2024-08-03 ENCOUNTER — Other Ambulatory Visit (HOSPITAL_COMMUNITY): Payer: Self-pay
